# Patient Record
Sex: Female | Born: 1990 | Race: White | Hispanic: No | Marital: Single | Smoking: Never smoker
Health system: Southern US, Community
[De-identification: ages and names within clinical notes are randomized; demographics above are authoritative.]

## PROBLEM LIST (undated history)

## (undated) DIAGNOSIS — F419 Anxiety disorder, unspecified: Secondary | ICD-10-CM

## (undated) DIAGNOSIS — K219 Gastro-esophageal reflux disease without esophagitis: Secondary | ICD-10-CM

## (undated) HISTORY — DX: Gastro-esophageal reflux disease without esophagitis: K21.9

## (undated) HISTORY — DX: Anxiety disorder, unspecified: F41.9

## (undated) HISTORY — PX: WISDOM TOOTH EXTRACTION: SHX21

---
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
## 2011-04-02 IMAGING — US US ABDOMEN COMPLETE
1 series · 14 of 25 positions shown · non-contrast
Comparison: None

CLINICAL DATA: Abdominal pain

COMPLETE ABDOMINAL ULTRASOUND

[Series 1: us abdomen complete · 0.24mm/px · 14 of 71 slices shown]
[im 1/71]
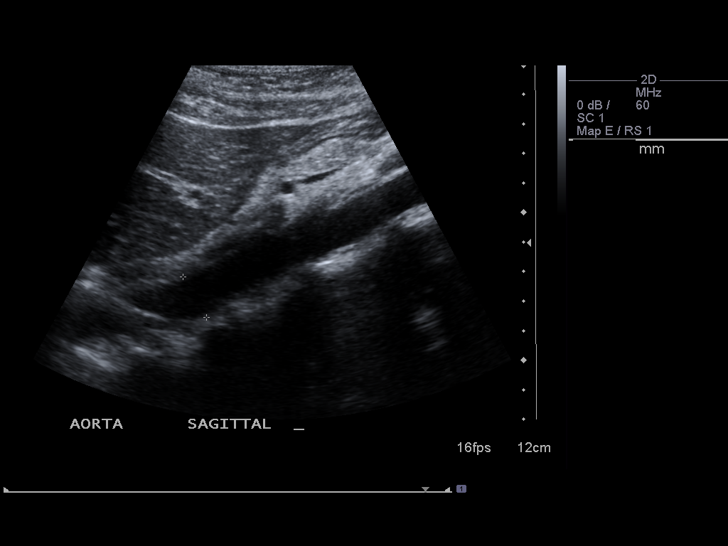
[im 6/71]
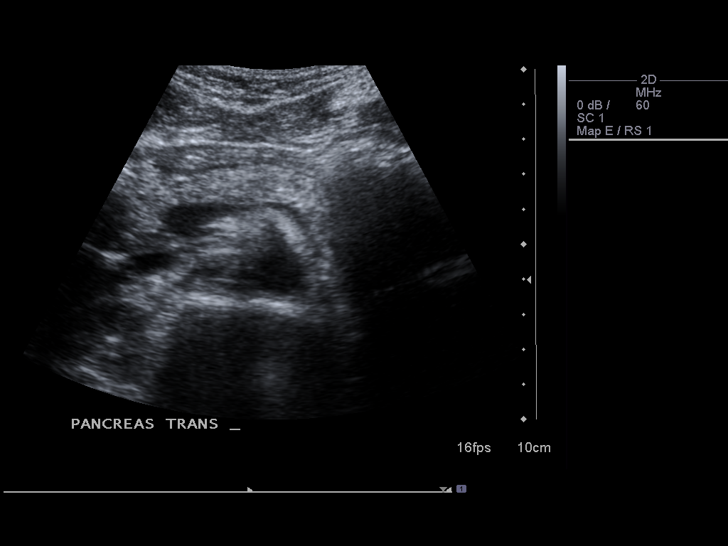
[im 12/71]
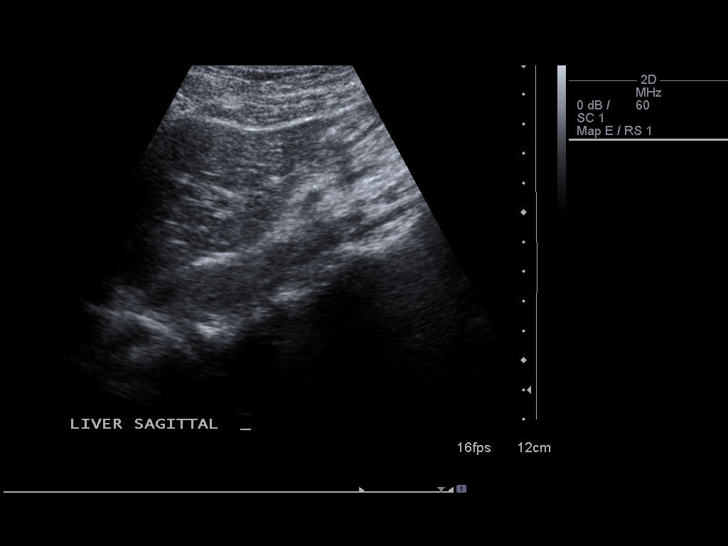
[im 18/71]
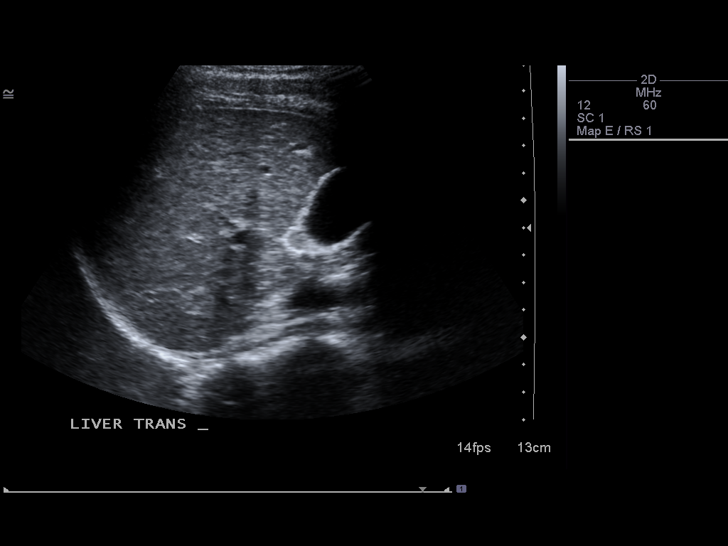
[im 24/71]
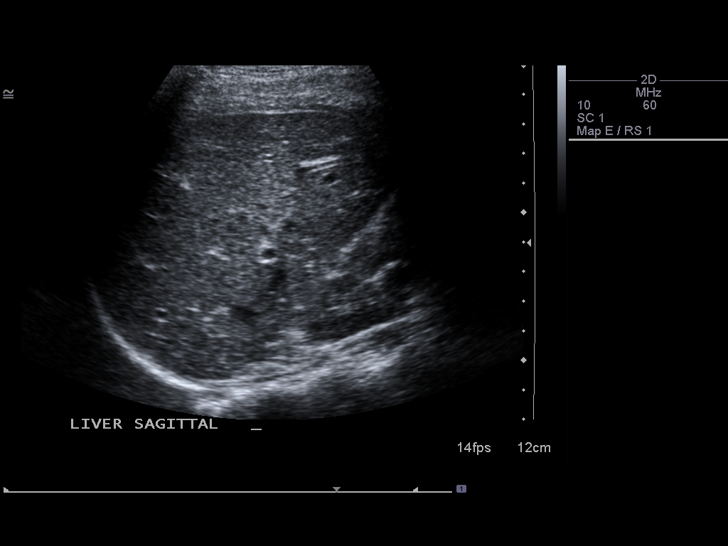
[im 27/71]
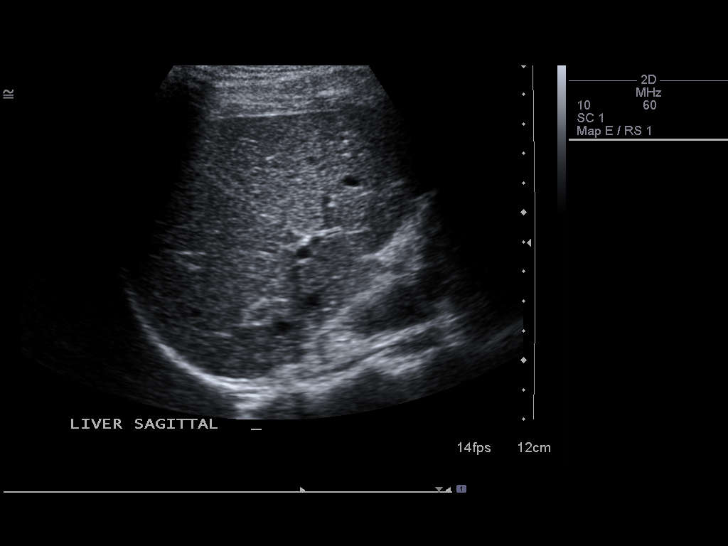
[im 33/71]
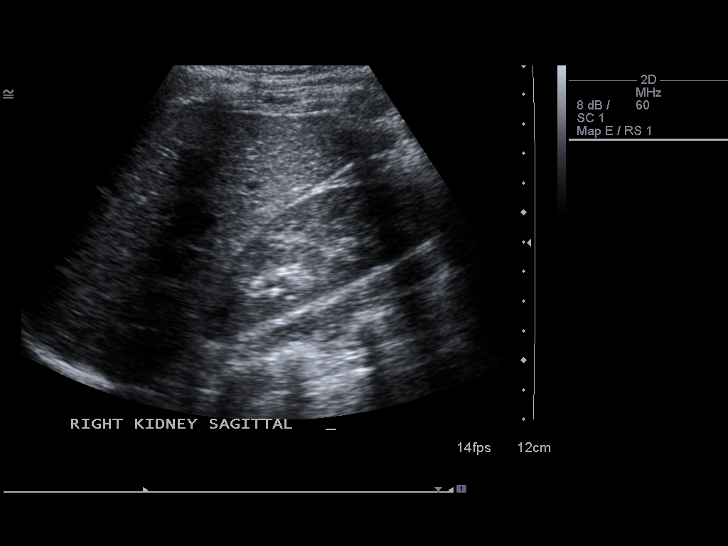
[im 38/71]
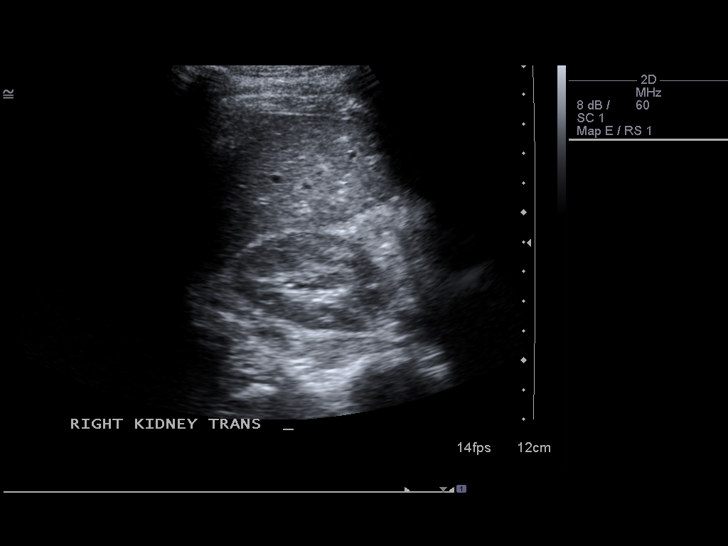
[im 44/71]
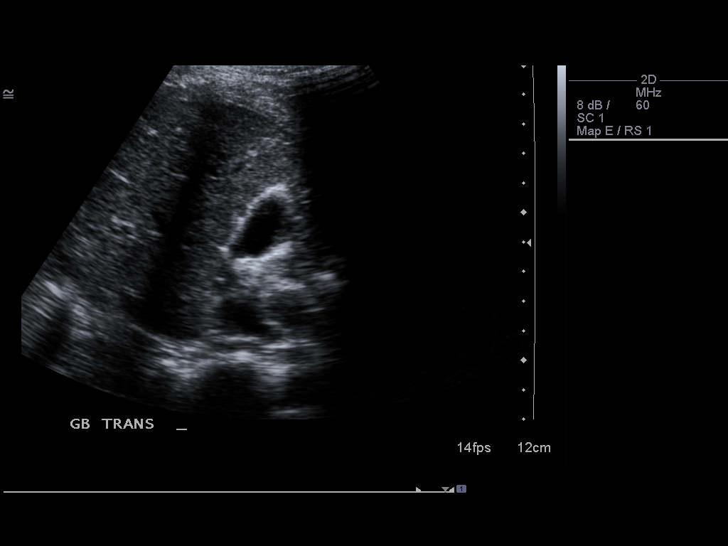
[im 47/71]
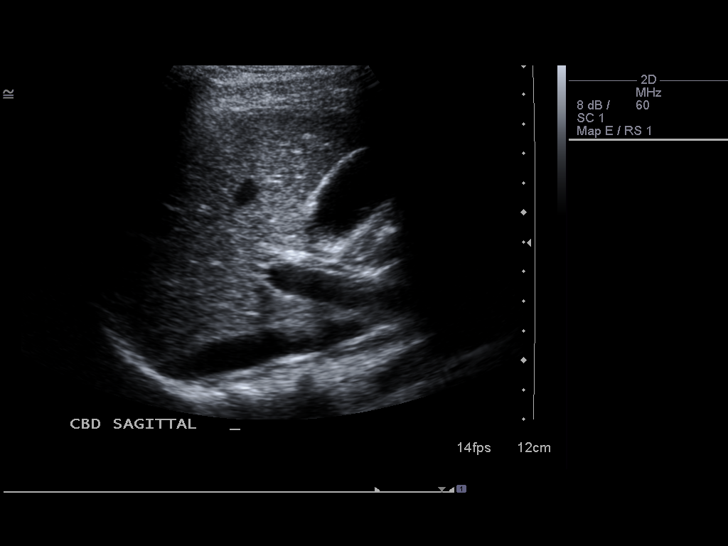
[im 53/71]
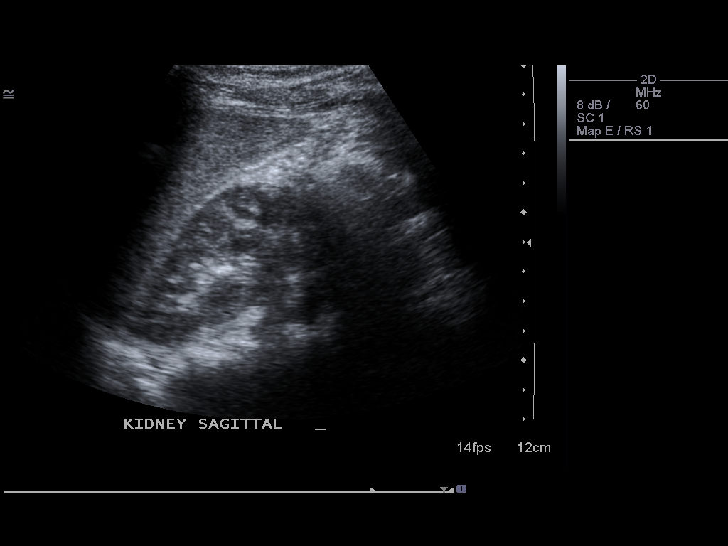
[im 59/71]
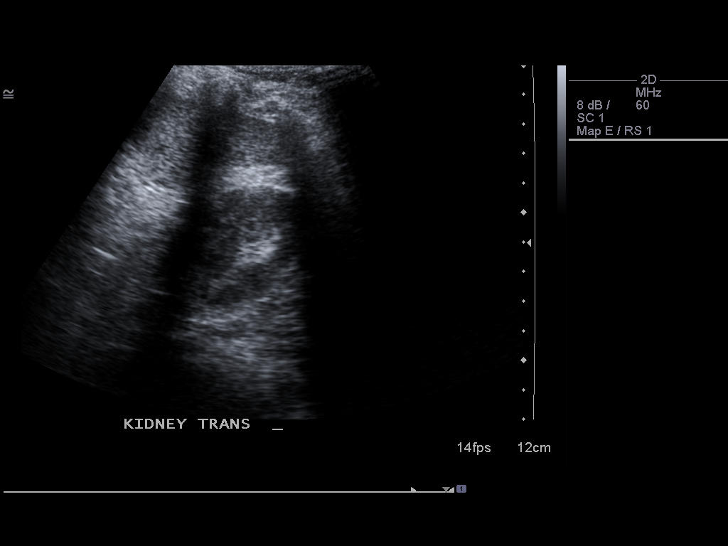
[im 65/71]
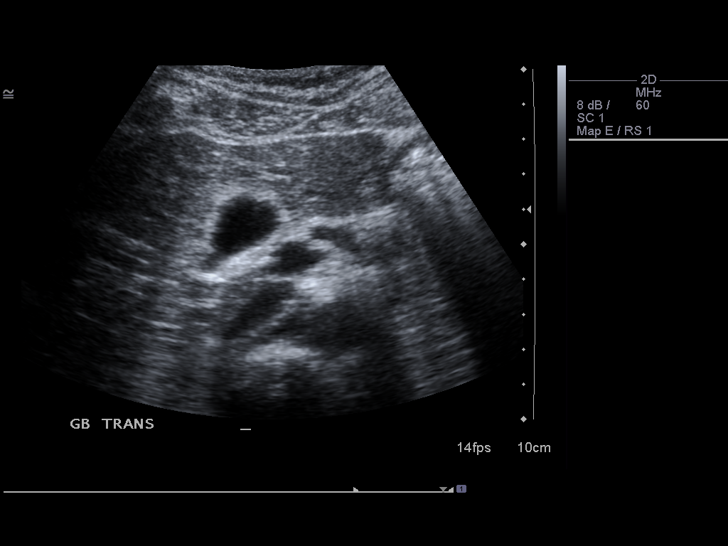
[im 71/71]
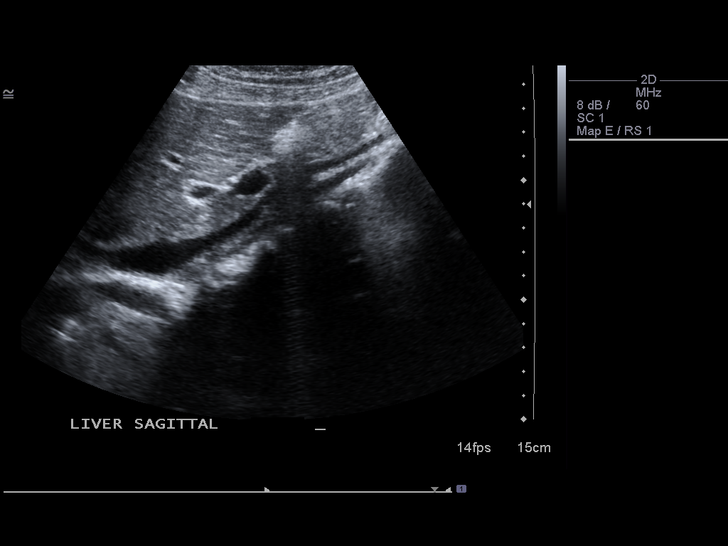

[14 of 25 positions shown; findings below may reference images not displayed]

FINDINGS: Gallbladder:  Normal without stones, sludge or wall thickening.

Common bile duct:  Normal at 2.5 mm.

Liver:  Normal echogenicity.  No focal lesions or biliary ductal
dilatation.

IVC:  Normal

Pancreas:  Normal

Spleen:  Normal at 9.3 cm.

Right Kidney:  Normal at 9.3 cm.  Echogenicity is normal.  No cyst,
mass, stone or hydronephrosis.

Left Kidney:  Similarly normal at 9.0 cm.

Abdominal aorta:  Normal.

No ascites.
IMPRESSION: Normal abdominal ultrasound.  No cause of pain identified.

## 2011-11-06 ENCOUNTER — Ambulatory Visit
Admission: RE | Admit: 2011-11-06 | Discharge: 2011-11-06 | Disposition: A | Discharge status: Home / Self Care | Payer: BC Managed Care – PPO | Source: Ambulatory Visit | Attending: Family Medicine | Admitting: Family Medicine

## 2011-11-06 ENCOUNTER — Other Ambulatory Visit: Payer: Self-pay | Admitting: Family Medicine

## 2011-11-06 DIAGNOSIS — R51 Headache: Secondary | ICD-10-CM

## 2012-01-02 ENCOUNTER — Ambulatory Visit (INDEPENDENT_AMBULATORY_CARE_PROVIDER_SITE_OTHER): Payer: BC Managed Care – PPO | Admitting: Internal Medicine

## 2012-01-02 VITALS — BP 114/72 | HR 69 | Temp 98.6°F | Resp 16 | Ht 63.5 in | Wt 132.0 lb

## 2012-01-02 DIAGNOSIS — R002 Palpitations: Secondary | ICD-10-CM

## 2012-01-02 DIAGNOSIS — G4700 Insomnia, unspecified: Secondary | ICD-10-CM | POA: Insufficient documentation

## 2012-01-02 DIAGNOSIS — F429 Obsessive-compulsive disorder, unspecified: Secondary | ICD-10-CM

## 2012-01-02 DIAGNOSIS — R0989 Other specified symptoms and signs involving the circulatory and respiratory systems: Secondary | ICD-10-CM

## 2012-01-02 DIAGNOSIS — R09A2 Foreign body sensation, throat: Secondary | ICD-10-CM

## 2012-01-02 MED ORDER — FLUOXETINE HCL 10 MG PO CAPS: ORAL_CAPSULE | ORAL | Status: DC

## 2012-01-02 NOTE — Progress Notes (Signed)
Subjective:    Patient ID: Lisa Clayton, female    DOB: Jun 28, 1990, 21 y.o.   MRN: [id number]  HPI Pt presents to office today to follow up after seeing a therapist in town earlier today. 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 She spent the summer at home in [place]. While home she began having thoughts of OCD, worrisome, and anxiousness. She is mostly worried about her future- I confirmed with her she had a great freshman year and grades were good. Her mother stated that this is not the first time this type of incident has occurred. When she was younger, she went through an episode of "worrying and OCD" type issues-Third-grade. Could not stay home alone due to fear/obsessive handwashing/obsessing with organization. Things seem moreintrusive with this current episode Ms. Gaut is having. She has a great deal of trouble Falling asleep Do to worrisome thoughts. Any depression symptoms are relatively mild and due to the anxiety that she feels about these worries. No panic attacks. The Pt expressed concern for herself not feeling like herself, as well as her mother did. The Pt returned from school after the spring semester and then shortly there after began having these thoughts and concerns. Since she is a returning sophomore tomorrow, the sense of pressure Ms. Montejano is feeling is overwhelming.  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"--thinking about career is one part of problem A little bored here this summer Brother Nate-[employer] senior/ADD no meds now Father has described having mild obsessions but no one else in the family has been diagnosed with OCD   Review of Systems Gained 12-15 pounds freshman year/understands why   Has had episodes of palpitations that made her stop exercising this summer/associated  with slight difficulty getting a deep breath/these would go way immediately if she would stop and control her thinking No syncope/no prior cardiac problems She also has had trouble with a sensation of something in her chest when she swallows, as if food gets stuck Was put on nexium earlier No hoarseness/no nausea vomiting/no change in appetite No suicide ideation/no memory problems or confusion/no overuse of alcohol or drugs History of dysmenorrhea controlled off oral contraceptives which she discontinued one to 2 months ago because her gynecologist was concerned that she might have headaches secondary Has been having frontal headaches on and off since late May/usually when most anxious/CT scan june was negative   Objective:   Physical Exam Filed Vitals:   01/02/12 1623  BP: 114/72  Pulse: 69  Temp: 98.6 F (37 C)  Resp: 16   HEENT clear No thyroid megaly or tenderness Heart regular without murmurs or clicks Neurological intact Mood good/affect stable       Assessment & Plan:   1. OCD (obsessive compulsive disorder)   2. Insomnia   3. Globus sensation   4. Palpitations    Meds ordered this encounter  Medications         . FLUoxetine (PROZAC) 10 MG capsule    Sig: Take one tablet a day for 7 days and then increase to 2 tablets once a day.May refill at 2 tablets once a day    Dispense:  60 capsule    Refill:  2  Continue counseling in [place] Followup here within the next 8-10 weeks/contact us phone sooner if needed Melatonin 5 mg at bedtime/if unsuccessful will  consider Klonopin short term

## 2012-01-02 NOTE — Patient Instructions (Signed)
Take 10 mg a day  For one week, then increase To 20 mg for the next 3 weeks you may take both pills at the same time When you refill your prescription you may continue to take the pills at once until we talk again to change that the dose if necessary Call if you have any questions

## 2012-01-03 ENCOUNTER — Telehealth: Payer: Self-pay

## 2012-01-03 MED ORDER — TRIAZOLAM 0.25 MG PO TABS: 0.2500 mg | ORAL_TABLET | Freq: Every evening | ORAL | Status: DC | PRN

## 2012-01-03 NOTE — Telephone Encounter (Signed)
See OV from yesterday-- rx'd Prozac and advised to try Melotonin at night for sleep.  Prozac will take time to build up in her system-- has she tried Melatonin as directed?  LMOM to CB.

## 2012-01-03 NOTE — Telephone Encounter (Signed)
She tried the Melatonin with no relief, would like something to help her sleep uses [employer].

## 2012-01-03 NOTE — Telephone Encounter (Signed)
I have spoken to her and she has changed her mind about which pharmacy to call this in to, I have called to [facility] [place] [phone]

## 2012-01-03 NOTE — Telephone Encounter (Signed)
The prozac is not working for sleeping.  Child is restless and mom would like a different medication for her.  Dr. Merla Riches   Call [phone]

## 2012-01-03 NOTE — Telephone Encounter (Signed)
We discussed this at her office visit and this is certainly fine/Halcion 0.25 #30 one at bedtime as needed and followup as planned/should be printed and waiting to be called in or faxed after signing

## 2012-01-05 ENCOUNTER — Telehealth: Payer: Self-pay

## 2012-01-05 NOTE — Telephone Encounter (Signed)
CAROL STATES HER DAUGHTER IS HAVING ANXIETY ISSUES AND DR DOOLITTLE HAD GIVEN HER PROZAC AND DR DAUB HAD GIVEN HER SOME MEDICINE TO HELP HER SLEEP AND NOTHING IS HELPING. SHE STILL IS HAVING PROBLEMS.  PLEASE CALL [phone]

## 2012-01-05 NOTE — Telephone Encounter (Signed)
Please advise-- should she RTC?

## 2012-01-05 NOTE — Telephone Encounter (Signed)
Having trouble at school/meds haven't started working yet He is still attending all classes Encouraged him to be patient in followup with me in 14-21 days if no effect from Prozac

## 2012-01-14 ENCOUNTER — Other Ambulatory Visit: Payer: Self-pay

## 2012-01-14 MED ORDER — FLUOXETINE HCL 10 MG PO CAPS: ORAL_CAPSULE | ORAL | Status: DC

## 2012-01-14 NOTE — Telephone Encounter (Signed)
Ok per Maralyn Sago, Mom notified.

## 2012-01-14 NOTE — Telephone Encounter (Signed)
Pt's mom called stated they are out of town in [place] and pt forgot meds.(Prozac) Pt needs 2 pills to be called into [facility] [phone] Please let pt know when done [phone]

## 2012-01-14 NOTE — Telephone Encounter (Signed)
Ok to rx

## 2012-01-30 ENCOUNTER — Telehealth: Payer: Self-pay

## 2012-01-30 NOTE — Telephone Encounter (Signed)
Please get details. 

## 2012-01-30 NOTE — Telephone Encounter (Signed)
Pt mom Hansini Clodfelter would like a return phone call, pt is taking anxiety medication, this is the fourth week and pt is not getting any results or feeling any better. [phone]

## 2012-01-31 NOTE — Telephone Encounter (Signed)
What dose is she taking of the Prozac?  Dr Merla Riches wanted her to increase this, she was instructed to start with 10mg  then increase to 20 mg. Not getting much relief yet,on the 20 mg dose. Please advise does the dosage need to be increased again.

## 2012-02-01 NOTE — Telephone Encounter (Signed)
Mother called today-Lisa Clayton has been on Prozac 20 mg for 4 weeks with little change/although she is doing okay at [employer], she has lots of negative thinking and lots of anxiety Mother will see her tonight and discuss whether she will followup in clinic this weekend whether or we will go up on her medicine to 30 mg of Prozac and see her in another 2 or 3 weeks.

## 2012-03-31 ENCOUNTER — Ambulatory Visit: Payer: BC Managed Care – PPO | Admitting: Internal Medicine

## 2012-03-31 VITALS — BP 118/77 | HR 66 | Temp 98.3°F | Resp 12 | Ht 63.0 in | Wt 133.0 lb

## 2012-03-31 DIAGNOSIS — Z23 Encounter for immunization: Secondary | ICD-10-CM

## 2012-03-31 DIAGNOSIS — F900 Attention-deficit hyperactivity disorder, predominantly inattentive type: Secondary | ICD-10-CM

## 2012-03-31 DIAGNOSIS — F988 Other specified behavioral and emotional disorders with onset usually occurring in childhood and adolescence: Secondary | ICD-10-CM

## 2012-03-31 DIAGNOSIS — F429 Obsessive-compulsive disorder, unspecified: Secondary | ICD-10-CM

## 2012-03-31 MED ORDER — AMPHETAMINE-DEXTROAMPHETAMINE 10 MG PO TABS: 10.0000 mg | ORAL_TABLET | Freq: Two times a day (BID) | ORAL | Status: DC

## 2012-03-31 MED ORDER — FLUOXETINE HCL 20 MG PO CAPS: ORAL_CAPSULE | ORAL | Status: DC

## 2012-03-31 NOTE — Progress Notes (Signed)
  Subjective:    Patient ID: Lisa Clayton, female    DOB: Sep 20, 1990, 21 y.o.   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 Has responded well to prozac with good sleep, control of obs thinking and 80% reduction in anxiety Good social Exercise less Now notes a big problem focusing during design 3 hr labs//also acknowleges being unable to complete paragraphs reading--hx of poor reading compr since grade school but with perfect behavior and working long hrs thruout school to get work done [employer] keys/room disorg/procrastinates brother age 46 w/ marked ADHD    Review of Systems No new sxt No ex intol    Objective:   Physical Exam Filed Vitals:   03/31/12 1339  BP: 118/77  Pulse: 66  Temp: 98.3 F (36.8 C)  Resp: 12  PERRLA-EOMs conj No thyromeg Ht reg w/out m,r,c,g Neuro intact    ADD/ADHD scale =48 with obvious inattention but surpringly also mild Hyperactiv sxt    Assessment & Plan:  ADHD Anxiety OCD symptoms  Meds ordered this encounter  Medications  . amphetamine-dextroamphetamine (ADDERALL) 10 MG tablet    Sig: Take 1 tablet (10 mg total) by mouth 2 (two) times daily.    Dispense:  60 tablet     To use meds in titration for lab and reading    Refill:  0  . FLUoxetine (PROZAC) 20 MG capsule    Sig: 2 tablets once a day    Dispense:  90 capsule    Refill:  1   F/u 4 -6 weeks xmas break OCD for dummies tho Mom has several books already

## 2012-04-24 ENCOUNTER — Ambulatory Visit: Payer: BC Managed Care – PPO | Admitting: Internal Medicine

## 2012-05-27 ENCOUNTER — Telehealth: Payer: Self-pay

## 2012-05-27 NOTE — Telephone Encounter (Signed)
Please advise 

## 2012-05-27 NOTE — Telephone Encounter (Signed)
Patient calling for refill on her adderall she missed appointment during the holidays but is now out would like dr Merla Riches to call her at [phone]

## 2012-05-28 MED ORDER — AMPHETAMINE-DEXTROAMPHETAMINE 10 MG PO TABS: 10.0000 mg | ORAL_TABLET | Freq: Two times a day (BID) | ORAL | Status: DC

## 2012-05-28 NOTE — Telephone Encounter (Signed)
Meds ordered this encounter  Medications  . amphetamine-dextroamphetamine (ADDERALL) 10 MG tablet    Sig: Take 1 tablet (10 mg total) by mouth 2 (two) times daily.    Dispense:  60 tablet    Refill:  0    

## 2012-05-28 NOTE — Telephone Encounter (Signed)
Pt reports that she is taking Adderall 10 mg Qam and sometimes she takes 1/2 or 1 tab in afternoons if needed for class/study schedule for a max of 10 mg BID.  Pt states that she will try to get in to see Dr Merla Riches on one of the weekends he works w/in the next month.

## 2012-05-28 NOTE — Telephone Encounter (Signed)
She was to titrate dose What is she using Can arange one month w/ f/u at spring break or convenient time

## 2012-05-29 NOTE — Telephone Encounter (Signed)
Called pt, advised Rx ready to pick up. 

## 2012-06-23 ENCOUNTER — Ambulatory Visit: Payer: BC Managed Care – PPO | Admitting: Internal Medicine

## 2012-06-23 VITALS — BP 122/85 | HR 71 | Temp 98.1°F | Resp 16 | Ht 63.5 in | Wt 132.0 lb

## 2012-06-23 DIAGNOSIS — F429 Obsessive-compulsive disorder, unspecified: Secondary | ICD-10-CM

## 2012-06-23 DIAGNOSIS — F988 Other specified behavioral and emotional disorders with onset usually occurring in childhood and adolescence: Secondary | ICD-10-CM

## 2012-06-23 MED ORDER — AMPHETAMINE-DEXTROAMPHETAMINE 10 MG PO TABS: 10.0000 mg | ORAL_TABLET | Freq: Two times a day (BID) | ORAL | Status: DC

## 2012-06-23 MED ORDER — FLUOXETINE HCL 20 MG PO CAPS: ORAL_CAPSULE | ORAL | Status: DC

## 2012-06-23 NOTE — Progress Notes (Signed)
  Subjective:    Patient ID: Lisa Clayton, female    DOB: 1990/09/02, 21 y.o.   MRN: [id number]  HPI followup for attention deficit disorder and excessive compulsive symptoms manifested with anxiety and insomnia Since her last visit she has had no trouble sleeping Her anxiety has been very well controlled, most likely due to the positive effects of Adderall She no longer has inefficiency with work that create sleep deprivation She is much better note taking/retention of information/task completion time She is no longer distractible  There is no social anxiety/no phobias/obsessive thinking about things has diminished    Review of Systems No depression No self injury No weight changes    Objective:   Physical Exam Vital signs stable Neurological intact Psychiatric intact       Assessment & Plan:  Problem 1 attention deficit disorder Problem #2 obsessive thinking with anxiety now stable  Plan continue Adderall/discussed specifics of duration and side effects  continue Prozac at 20 mg/consider decreasing Prozac the summer 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

## 2012-08-20 ENCOUNTER — Telehealth: Payer: Self-pay

## 2012-08-20 DIAGNOSIS — F429 Obsessive-compulsive disorder, unspecified: Secondary | ICD-10-CM

## 2012-08-20 NOTE — Telephone Encounter (Signed)
I need to pull chart

## 2012-08-20 NOTE — Telephone Encounter (Signed)
11/01/08 is chart date, these are next door.

## 2012-08-20 NOTE — Telephone Encounter (Signed)
Pt states she thinks she told dr Merla Riches the wrong dosage on her prozac,she actually takes 40 mg   Best phone [phone]  Pharmacy [facility] [address] in [place]

## 2012-08-21 ENCOUNTER — Other Ambulatory Visit: Payer: Self-pay | Admitting: Internal Medicine

## 2012-08-21 ENCOUNTER — Telehealth: Payer: Self-pay

## 2012-08-21 DIAGNOSIS — F429 Obsessive-compulsive disorder, unspecified: Secondary | ICD-10-CM

## 2012-08-21 MED ORDER — FLUOXETINE HCL 20 MG PO CAPS: 40.0000 mg | ORAL_CAPSULE | Freq: Every day | ORAL | Status: DC

## 2012-08-21 NOTE — Telephone Encounter (Signed)
Okay to continue Prozac 20 mg 2 tablets daily and she is considering decreasing her dose this summer Meds ordered this encounter  Medications  . FLUoxetine (PROZAC) 20 MG capsule    Sig: Take 2 capsules (40 mg total) by mouth daily.    Dispense:  180 capsule    Refill:  1

## 2012-08-21 NOTE — Telephone Encounter (Signed)
Called and LMOM at pt's number that corrected Rx was sent to pharmacy. Tried to call mother's number also and there was no ans/no VM.

## 2012-08-21 NOTE — Telephone Encounter (Signed)
I spoke to mother, and she advised she has been taking this bid , this was written in Aug and in Nov for bid, but at last visit was sent in for qd. Please advise. Pended at bid dose

## 2012-08-21 NOTE — Telephone Encounter (Signed)
Mom called:  Daughter is completely out of FLUoxetine (PROZAC) 20 MG capsule Has not taken for two days now and mom is concerned.   Moms phone number is [phone] - she will be hard to reach   Patient phone is [phone]  - a student and may not be available.   URGENT

## 2012-08-21 NOTE — Telephone Encounter (Signed)
On 06/23/12 pt was rx'd #90 with 1 RF.  Taking one per day as rx'd this should last 6 months.  Is this not the case?  Is there a RF at the pharmacy

## 2012-08-21 NOTE — Telephone Encounter (Signed)
Can we refill? 

## 2012-08-21 NOTE — Telephone Encounter (Signed)
Dr Merla Riches, see phone message before responding please.

## 2012-08-21 NOTE — Telephone Encounter (Signed)
Dr Merla Riches, I'm forwarding a Refill Request to you for pt also, pharmacy states Prozac 20 mg two tablets QD is correct dose. I wanted to check w/you before OKing the 40 mg dose. Do you want to send in for 40 mg Caps #30 or #90 instead of her taking two 20 mg? If you need paper chart it is stored at 104 (daily 11/01/08).

## 2012-08-21 NOTE — Telephone Encounter (Signed)
Corrected Rx was sent in and pt notified on VM. No ans/no VM set up on mother's #.

## 2012-09-23 ENCOUNTER — Telehealth: Payer: Self-pay

## 2012-09-23 DIAGNOSIS — F988 Other specified behavioral and emotional disorders with onset usually occurring in childhood and adolescence: Secondary | ICD-10-CM

## 2012-09-23 MED ORDER — AMPHETAMINE-DEXTROAMPHETAMINE 10 MG PO TABS: 10.0000 mg | ORAL_TABLET | Freq: Two times a day (BID) | ORAL | Status: DC

## 2012-09-23 NOTE — Telephone Encounter (Signed)
Please advise, pending.

## 2012-09-23 NOTE — Telephone Encounter (Signed)
PT IN NEED OF HER ADDERALL. PLEASE CALL [phone] WHEN READY FOR P/U

## 2012-09-23 NOTE — Telephone Encounter (Signed)
Meds ordered this encounter  Medications  . amphetamine-dextroamphetamine (ADDERALL) 10 MG tablet    Sig: Take 1 tablet (10 mg total) by mouth 2 (two) times daily. For 11/27/12    Dispense:  60 tablet    Refill:  0  . amphetamine-dextroamphetamine (ADDERALL) 10 MG tablet    Sig: Take 1 tablet (10 mg total) by mouth 2 (two) times daily. For 10/28/12    Dispense:  60 tablet    Refill:  0  . amphetamine-dextroamphetamine (ADDERALL) 10 MG tablet    Sig: Take 1 tablet (10 mg total) by mouth 2 (two) times daily.    Dispense:  60 tablet    Refill:  0

## 2012-09-24 NOTE — Telephone Encounter (Signed)
Called patient advised Rx ready for pick up 

## 2012-11-18 ENCOUNTER — Telehealth: Payer: Self-pay

## 2012-11-18 DIAGNOSIS — F429 Obsessive-compulsive disorder, unspecified: Secondary | ICD-10-CM

## 2012-11-18 NOTE — Telephone Encounter (Signed)
CAROL STATES HER DAUGHTER IN NEED OF HER PROZAC. PLEASE CALL [phone]

## 2012-11-18 NOTE — Telephone Encounter (Signed)
Please advise pended 

## 2012-11-19 MED ORDER — FLUOXETINE HCL 20 MG PO CAPS: 40.0000 mg | ORAL_CAPSULE | Freq: Every day | ORAL | Status: DC

## 2012-11-19 NOTE — Telephone Encounter (Signed)
Sent.  Due for follow up this summer, per Dr. Netta Corrigan last OV note

## 2012-11-26 ENCOUNTER — Encounter: Payer: Self-pay | Admitting: Certified Nurse Midwife

## 2012-12-03 ENCOUNTER — Ambulatory Visit: Payer: BC Managed Care – PPO | Admitting: Certified Nurse Midwife

## 2012-12-03 ENCOUNTER — Encounter: Payer: Self-pay | Admitting: Certified Nurse Midwife

## 2012-12-03 DIAGNOSIS — Z01419 Encounter for gynecological examination (general) (routine) without abnormal findings: Secondary | ICD-10-CM

## 2012-12-17 ENCOUNTER — Ambulatory Visit (INDEPENDENT_AMBULATORY_CARE_PROVIDER_SITE_OTHER): Payer: Managed Care, Other (non HMO) | Admitting: Certified Nurse Midwife

## 2012-12-17 ENCOUNTER — Encounter: Payer: Self-pay | Admitting: Certified Nurse Midwife

## 2012-12-17 VITALS — BP 100/62 | HR 60 | Resp 16 | Ht 63.25 in | Wt 138.0 lb

## 2012-12-17 DIAGNOSIS — Z0000 Encounter for general adult medical examination without abnormal findings: Secondary | ICD-10-CM

## 2012-12-17 DIAGNOSIS — Z01419 Encounter for gynecological examination (general) (routine) without abnormal findings: Secondary | ICD-10-CM

## 2012-12-17 NOTE — Patient Instructions (Signed)
General topics  Next pap or exam is  due in 1 year Take a Women's multivitamin Take 1200 mg. of calcium daily - prefer dietary If any concerns in interim to call back  Breast Self-Awareness Practicing breast self-awareness may pick up problems early, prevent significant medical complications, and possibly save your life. By practicing breast self-awareness, you can become familiar with how your breasts look and feel and if your breasts are changing. This allows you to notice changes early. It can also offer you some reassurance that your breast health is good. One way to learn what is normal for your breasts and whether your breasts are changing is to do a breast self-exam. If you find a lump or something that was not present in the past, it is best to contact your caregiver right away. Other findings that should be evaluated by your caregiver include nipple discharge, especially if it is bloody; skin changes or reddening; areas where the skin seems to be pulled in (retracted); or new lumps and bumps. Breast pain is seldom associated with cancer (malignancy), but should also be evaluated by a caregiver. BREAST SELF-EXAM The best time to examine your breasts is 5 7 days after your menstrual period is over.  ExitCare Patient Information 2013 [employer].   Exercise to Stay Healthy Exercise helps you become and stay healthy. EXERCISE IDEAS AND TIPS Choose exercises that:  You enjoy.  Fit into your day. You do not need to exercise really hard to be healthy. You can do exercises at a slow or medium level and stay healthy. You can:  Stretch before and after working out.  Try yoga, Pilates, or tai chi.  Lift weights.  Walk fast, swim, jog, run, climb stairs, bicycle, dance, or rollerskate.  Take aerobic classes. Exercises that burn about 150 calories:  Running 1  miles in 15 minutes.  Playing volleyball for 45 to 60 minutes.  Washing and waxing a car for 45 to 60  minutes.  Playing touch football for 45 minutes.  Walking 1  miles in 35 minutes.  Pushing a stroller 1  miles in 30 minutes.  Playing basketball for 30 minutes.  Raking leaves for 30 minutes.  Bicycling 5 miles in 30 minutes.  Walking 2 miles in 30 minutes.  Dancing for 30 minutes.  Shoveling snow for 15 minutes.  Swimming laps for 20 minutes.  Walking up stairs for 15 minutes.  Bicycling 4 miles in 15 minutes.  Gardening for 30 to 45 minutes.  Jumping rope for 15 minutes.  Washing windows or floors for 45 to 60 minutes. Document Released: 06/03/2010 Document Revised: 07/24/2011 Document Reviewed: 06/03/2010 ExitCare Patient Information 2013 [employer].   Other topics ( that may be useful information):    Sexually Transmitted Disease Sexually transmitted disease (STD) refers to any infection that is passed from person to person during sexual activity. This may happen by way of saliva, semen, blood, vaginal mucus, or urine. Common STDs include:  Gonorrhea.  Chlamydia.  Syphilis.  HIV/AIDS.  Genital herpes.  Hepatitis B and C.  Trichomonas.  Human papillomavirus (HPV).  Pubic lice. CAUSES  An STD may be spread by bacteria, virus, or parasite. A person can get an STD by:  Sexual intercourse with an infected person.  Sharing sex toys with an infected person.  Sharing needles with an infected person.  Having intimate contact with the genitals, mouth, or rectal areas of an infected person. SYMPTOMS  Some people may not have any symptoms, but   they can still pass the infection to others. Different STDs have different symptoms. Symptoms include:  Painful or bloody urination.  Pain in the pelvis, abdomen, vagina, anus, throat, or eyes.  Skin rash, itching, irritation, growths, or sores (lesions). These usually occur in the genital or anal area.  Abnormal vaginal discharge.  Penile discharge in men.  Soft, flesh-colored skin growths in the  genital or anal area.  Fever.  Pain or bleeding during sexual intercourse.  Swollen glands in the groin area.  Yellow skin and eyes (jaundice). This is seen with hepatitis. DIAGNOSIS  To make a diagnosis, your caregiver may:  Take a medical history.  Perform a physical exam.  Take a specimen (culture) to be examined.  Examine a sample of discharge under a microscope.  Perform blood test TREATMENT   Chlamydia, gonorrhea, trichomonas, and syphilis can be cured with antibiotic medicine.  Genital herpes, hepatitis, and HIV can be treated, but not cured, with prescribed medicines. The medicines will lessen the symptoms.  Genital warts from HPV can be treated with medicine or by freezing, burning (electrocautery), or surgery. Warts may come back.  HPV is a virus and cannot be cured with medicine or surgery.However, abnormal areas may be followed very closely by your caregiver and may be removed from the cervix, vagina, or vulva through office procedures or surgery. If your diagnosis is confirmed, your recent sexual partners need treatment. This is true even if they are symptom-free or have a negative culture or evaluation. They should not have sex until their caregiver says it is okay. HOME CARE INSTRUCTIONS  All sexual partners should be informed, tested, and treated for all STDs.  Take your antibiotics as directed. Finish them even if you start to feel better.  Only take over-the-counter or prescription medicines for pain, discomfort, or fever as directed by your caregiver.  Rest.  Eat a balanced diet and drink enough fluids to keep your urine clear or pale yellow.  Do not have sex until treatment is completed and you have followed up with your caregiver. STDs should be checked after treatment.  Keep all follow-up appointments, Pap tests, and blood tests as directed by your caregiver.  Only use latex condoms and water-soluble lubricants during sexual activity. Do not use  petroleum jelly or oils.  Avoid alcohol and illegal drugs.  Get vaccinated for HPV and hepatitis. If you have not received these vaccines in the past, talk to your caregiver about whether one or both might be right for you.  Avoid risky sex practices that can break the skin. The only way to avoid getting an STD is to avoid all sexual activity.Latex condoms and dental dams (for oral sex) will help lessen the risk of getting an STD, but will not completely eliminate the risk. SEEK MEDICAL CARE IF:   You have a fever.  You have any new or worsening symptoms. Document Released: 07/22/2002 Document Revised: 07/24/2011 Document Reviewed: 07/29/2010 ExitCare Patient Information 2013 [employer].    Domestic Abuse You are being battered or abused if someone close to you hits, pushes, or physically hurts you in any way. You also are being abused if you are forced into activities. You are being sexually abused if you are forced to have sexual contact of any kind. You are being emotionally abused if you are made to feel worthless or if you are constantly threatened. It is important to remember that help is available. No one has the right to abuse you. PREVENTION OF FURTHER   ABUSE  Learn the warning signs of danger. This varies with situations but may include: the use of alcohol, threats, isolation from friends and family, or forced sexual contact. Leave if you feel that violence is going to occur.  If you are attacked or beaten, report it to the police so the abuse is documented. You do not have to press charges. The police can protect you while you or the attackers are leaving. Get the officer's name and badge number and a copy of the report.  Find someone you can trust and tell them what is happening to you: your caregiver, a nurse, clergy member, close friend or family member. Feeling ashamed is natural, but remember that you have done nothing wrong. No one deserves abuse. Document Released:  04/28/2000 Document Revised: 07/24/2011 Document Reviewed: 07/07/2010 ExitCare Patient Information 2013 [employer].    How Much is Too Much Alcohol? Drinking too much alcohol can cause injury, accidents, and health problems. These types of problems can include:   Car crashes.  Falls.  Family fighting (domestic violence).  Drowning.  Fights.  Injuries.  Burns.  Damage to certain organs.  Having a baby with birth defects. ONE DRINK CAN BE TOO MUCH WHEN YOU ARE:  Working.  Pregnant or breastfeeding.  Taking medicines. Ask your doctor.  Driving or planning to drive. If you or someone you know has a drinking problem, get help from a doctor.  Document Released: 02/25/2009 Document Revised: 07/24/2011 Document Reviewed: 02/25/2009 ExitCare Patient Information 2013 [employer].   Smoking Hazards Smoking cigarettes is extremely bad for your health. Tobacco smoke has over 200 known poisons in it. There are over 60 chemicals in tobacco smoke that cause cancer. Some of the chemicals found in cigarette smoke include:   Cyanide.  Benzene.  Formaldehyde.  Methanol (wood alcohol).  Acetylene (fuel used in welding torches).  Ammonia. Cigarette smoke also contains the poisonous gases nitrogen oxide and carbon monoxide.  Cigarette smokers have an increased risk of many serious medical problems and Smoking causes approximately:  90% of all lung cancer deaths in men.  80% of all lung cancer deaths in women.  90% of deaths from chronic obstructive lung disease. Compared with nonsmokers, smoking increases the risk of:  Coronary heart disease by 2 to 4 times.  Stroke by 2 to 4 times.  Men developing lung cancer by 23 times.  Women developing lung cancer by 13 times.  Dying from chronic obstructive lung diseases by 12 times.  . Smoking is the most preventable cause of death and disease in our society.  WHY IS SMOKING ADDICTIVE?  Nicotine is the chemical  agent in tobacco that is capable of causing addiction or dependence.  When you smoke and inhale, nicotine is absorbed rapidly into the bloodstream through your lungs. Nicotine absorbed through the lungs is capable of creating a powerful addiction. Both inhaled and non-inhaled nicotine may be addictive.  Addiction studies of cigarettes and spit tobacco show that addiction to nicotine occurs mainly during the teen years, when young people begin using tobacco products. WHAT ARE THE BENEFITS OF QUITTING?  There are many health benefits to quitting smoking.   Likelihood of developing cancer and heart disease decreases. Health improvements are seen almost immediately.  Blood pressure, pulse rate, and breathing patterns start returning to normal soon after quitting. QUITTING SMOKING   American Lung Association - 1-800-LUNGUSA  American Cancer Society - 1-800-ACS-2345 Document Released: 06/08/2004 Document Revised: 07/24/2011 Document Reviewed: 02/10/2009 ExitCare Patient Information 2013 ExitCare[employer]   LLC.   Stress Management Stress is a state of physical or mental tension that often results from changes in your life or normal routine. Some common causes of stress are:  Death of a loved one.  Injuries or severe illnesses.  Getting fired or changing jobs.  Moving into a new home. Other causes may be:  Sexual problems.  Business or financial losses.  Taking on a large debt.  Regular conflict with someone at home or at work.  Constant tiredness from lack of sleep. It is not just bad things that are stressful. It may be stressful to:  Win the lottery.  Get married.  Buy a new car. The amount of stress that can be easily tolerated varies from person to person. Changes generally cause stress, regardless of the types of change. Too much stress can affect your health. It may lead to physical or emotional problems. Too little stress (boredom) may also become stressful. SUGGESTIONS TO  REDUCE STRESS:  Talk things over with your family and friends. It often is helpful to share your concerns and worries. If you feel your problem is serious, you may want to get help from a professional counselor.  Consider your problems one at a time instead of lumping them all together. Trying to take care of everything at once may seem impossible. List all the things you need to do and then start with the most important one. Set a goal to accomplish 2 or 3 things each day. If you expect to do too many in a single day you will naturally fail, causing you to feel even more stressed.  Do not use alcohol or drugs to relieve stress. Although you may feel better for a short time, they do not remove the problems that caused the stress. They can also be habit forming.  Exercise regularly - at least 3 times per week. Physical exercise can help to relieve that "uptight" feeling and will relax you.  The shortest distance between despair and hope is often a good night's sleep.  Go to bed and get up on time allowing yourself time for appointments without being rushed.  Take a short "time-out" period from any stressful situation that occurs during the day. Close your eyes and take some deep breaths. Starting with the muscles in your face, tense them, hold it for a few seconds, then relax. Repeat this with the muscles in your neck, shoulders, hand, stomach, back and legs.  Take good care of yourself. Eat a balanced diet and get plenty of rest.  Schedule time for having fun. Take a break from your daily routine to relax. HOME CARE INSTRUCTIONS   Call if you feel overwhelmed by your problems and feel you can no longer manage them on your own.  Return immediately if you feel like hurting yourself or someone else. Document Released: 10/25/2000 Document Revised: 07/24/2011 Document Reviewed: 06/17/2007 ExitCare Patient Information 2013 ExitCare, [employer].   

## 2012-12-17 NOTE — Progress Notes (Signed)
22 y.o. G0P0000 Single Caucasian Fe here for annual exam. Periods normal. Contraception condoms consistently. No partner change.  Desires Gc,Chlamydia screening only. College going well. No health concerns.  Patient's last menstrual period was 11/29/2012.          Sexually active: yes  The current method of family planning is condoms all the time.    Exercising: yes  running & the gym Smoker:  no  Health Maintenance: Pap:  never MMG:  none Colonoscopy:  none BMD:   none TDaP:  2006 Labs: none Self breast exam: done occ   reports that she has never smoked. She does not have any smokeless tobacco history on file. She reports that she does not drink alcohol or use illicit drugs.  Past Medical History  Diagnosis Date  . Acid reflux disease with ulcer     History reviewed. No pertinent past surgical history.  Current Outpatient Prescriptions  Medication Sig Dispense Refill  . amphetamine-dextroamphetamine (ADDERALL) 10 MG tablet Take 1 tablet (10 mg total) by mouth 2 (two) times daily. For 11/27/12  60 tablet  0  . esomeprazole (NEXIUM) 20 MG capsule Take 20 mg by mouth as needed.      Marland Kitchen FLUoxetine (PROZAC) 20 MG capsule Take 20 mg by mouth daily.       No current facility-administered medications for this visit.    Family History  Problem Relation Age of Onset  . Hypertension Father   . Hypertension Paternal Grandfather   . Heart disease Maternal Grandfather     ROS:  Pertinent items are noted in HPI.  Otherwise, a comprehensive ROS was negative.  Exam:   BP 100/62  Pulse 60  Resp 16  Ht 5' 3.25" (1.607 m)  Wt 138 lb (62.596 kg)  BMI 24.24 kg/m2  LMP 11/29/2012 Height: 5' 3.25" (160.7 cm)  Ht Readings from Last 3 Encounters:  12/17/12 5' 3.25" (1.607 m)  06/23/12 5' 3.5" (1.613 m)  03/31/12 5\' 3"  (1.6 m)    General appearance: alert, cooperative and appears stated age Head: Normocephalic, without obvious abnormality, atraumatic Neck: no adenopathy, supple,  symmetrical, trachea midline and thyroid normal to inspection and palpation Lungs: clear to auscultation bilaterally Breasts: normal appearance, no masses or tenderness, No nipple retraction or dimpling, No nipple discharge or bleeding, No axillary or supraclavicular adenopathy Heart: regular rate and rhythm Abdomen: soft, non-tender; no masses,  no organomegaly Extremities: extremities normal, atraumatic, no cyanosis or edema Skin: Skin color, texture, turgor normal. No rashes or lesions Lymph nodes: Cervical, supraclavicular, and axillary nodes normal. No abnormal inguinal nodes palpated Neurologic: Grossly normal   Pelvic: External genitalia:  no lesions              Urethra:  normal appearing urethra with no masses, tenderness or lesions              Bartholin's and Skene's: normal                 Vagina: normal appearing vagina with normal color and discharge, no lesions              Cervix: normal, non tender              Pap taken: no Bimanual Exam:  Uterus:  normal size, contour, position, consistency, mobility, non-tender and anteverted              Adnexa: normal adnexa and no mass, fullness, tenderness  Rectovaginal: Confirms               Anus:  normal sphincter tone, no lesions  A:  Well Woman with normal exam  Contraception condoms consistent  STD screening   P:   Reviewed health and wellness pertinent to exam  Lab:GC, Chlamydia  Pap smear as per guidelines   pap smear not taken today  counseled on breast self exam, STD prevention, adequate intake of calcium and vitamin D, diet and exercise  return annually or prn  An After Visit Summary was printed and given to the patient.

## 2012-12-19 NOTE — Progress Notes (Signed)
Note reviewed, agree with plan.  Aisea Bouldin, MD  

## 2012-12-30 ENCOUNTER — Telehealth: Payer: Self-pay

## 2012-12-30 DIAGNOSIS — F988 Other specified behavioral and emotional disorders with onset usually occurring in childhood and adolescence: Secondary | ICD-10-CM

## 2012-12-30 NOTE — Telephone Encounter (Signed)
PT STATES SHE WAS TOLD SHE NEEDED TO COME IN FOR HER MEDS, BUT SHE WOULD LIKE TO SPEAK WITH DR DOOLITTLE SINCE SHE IS IN [facility] AND WILL BE STARTING HER CLASSES BEFORE SHE IS ABLE TO COME. PLEASE CALL [phone]

## 2012-12-31 MED ORDER — AMPHETAMINE-DEXTROAMPHETAMINE 10 MG PO TABS: 10.0000 mg | ORAL_TABLET | Freq: Two times a day (BID) | ORAL | Status: DC

## 2012-12-31 NOTE — Telephone Encounter (Signed)
Is due for f/u but will excuse since college starting Meds ordered this encounter  Medications  . amphetamine-dextroamphetamine (ADDERALL) 10 MG tablet    Sig: Take 1 tablet (10 mg total) by mouth 2 (two) times daily. For 11/27/12    Dispense:  60 tablet    Refill:  0  see what can be set up for f/u as we need to discuss prozac as well---? Weekend in sept for clinic visit or can work in to clinic tomorrow at 10 or 430

## 2012-12-31 NOTE — Telephone Encounter (Signed)
Dr Merla Riches did you just want to refill her Rx until she can come in? Please advise

## 2013-01-01 ENCOUNTER — Telehealth: Payer: Self-pay

## 2013-01-01 NOTE — Telephone Encounter (Signed)
Refill amphetamine-dextroamphetamine (ADDERALL) 10 MG tablet Wants to talk with dr Merla Riches    [phone]

## 2013-01-01 NOTE — Telephone Encounter (Signed)
Called/mailbox full Try calling tomorrow for me-I'm back fri---left add for her 8/19

## 2013-01-01 NOTE — Telephone Encounter (Signed)
LM on cell phone to rtn call

## 2013-01-02 NOTE — Telephone Encounter (Signed)
Spoke with pt she will be in to see Dr Merla Riches as soon as she comes home.

## 2013-01-02 NOTE — Telephone Encounter (Signed)
Spoke with pt, advised message from Dr. Doolittle. Pt understood. 

## 2013-02-03 ENCOUNTER — Telehealth: Payer: Self-pay

## 2013-02-03 DIAGNOSIS — F988 Other specified behavioral and emotional disorders with onset usually occurring in childhood and adolescence: Secondary | ICD-10-CM

## 2013-02-03 NOTE — Telephone Encounter (Signed)
pts mom requesting adderall refil(pt completely out).She is away at college and is going to come in Friday Morning to see dr Lavenia Atlas but needs rx today.   Best phone for mom [phone]

## 2013-02-04 MED ORDER — AMPHETAMINE-DEXTROAMPHETAMINE 10 MG PO TABS: 10.0000 mg | ORAL_TABLET | Freq: Two times a day (BID) | ORAL | Status: DC

## 2013-02-04 NOTE — Telephone Encounter (Signed)
Meds ordered this encounter  Medications  . amphetamine-dextroamphetamine (ADDERALL) 10 MG tablet    Sig: Take 1 tablet (10 mg total) by mouth 2 (two) times daily. Needs Office Visit    Dispense:  60 tablet    Refill:  0

## 2013-02-04 NOTE — Telephone Encounter (Signed)
Have already sent to Dr Merla Riches and pended the medication.

## 2013-02-04 NOTE — Telephone Encounter (Signed)
Patient advised; Rx at front desk

## 2013-02-04 NOTE — Telephone Encounter (Signed)
CAROL WOULD LIKE DR DOOLITTLE TO GET THIS MESSAGE BEFORE HE STARTS WORKING TODAY. SHE WILL HAVE TO GO TO [facility] AND TAKE IT TO HER DAUGHTER PLEASE CALL [phone]

## 2013-02-07 ENCOUNTER — Ambulatory Visit: Payer: Managed Care, Other (non HMO) | Admitting: Internal Medicine

## 2013-02-07 VITALS — BP 118/74 | HR 94 | Temp 99.1°F | Resp 20 | Ht 63.5 in | Wt 138.2 lb

## 2013-02-07 DIAGNOSIS — F988 Other specified behavioral and emotional disorders with onset usually occurring in childhood and adolescence: Secondary | ICD-10-CM

## 2013-02-07 DIAGNOSIS — G4700 Insomnia, unspecified: Secondary | ICD-10-CM

## 2013-02-07 DIAGNOSIS — F429 Obsessive-compulsive disorder, unspecified: Secondary | ICD-10-CM

## 2013-02-07 MED ORDER — AMPHETAMINE-DEXTROAMPHETAMINE 20 MG PO TABS: 20.0000 mg | ORAL_TABLET | Freq: Two times a day (BID) | ORAL | Status: DC

## 2013-02-07 MED ORDER — FLUOXETINE HCL 10 MG PO TABS: 10.0000 mg | ORAL_TABLET | Freq: Every day | ORAL | Status: DC

## 2013-02-07 NOTE — Progress Notes (Signed)
  Subjective:    Patient ID: Lisa Clayton, female    DOB: 1990-09-10, 22 y.o.   MRN: [id number]  HPI Started Prozacand responded well last year/ interested in weaning off of this.  Wonders if she does not need it since she is doing so well on Adderall. Concerned about making any changes during the school year.   Adderall started last December - doing very well on this.   Takes Adderall 10 mg daily - possibly interested in increasing to 20 mg in the morning.  Has been taking 2/10 mg tablets on occasion which does seem to help more. Only will take the second dose of the 10 mg tablet if needed. No hx of panic attacks. She is otherwise doing well with no other concerns today.    Review of Systems  Constitutional: Negative for unexpected weight change.  Respiratory: Negative for chest tightness.   Cardiovascular: Negative for chest pain and palpitations.   no neurological problems No depression Sleeping well Good roommate situation     Objective:   Physical Exam BP 118/74  Pulse 94  Temp(Src) 99.1 F (37.3 C) (Oral)  Resp 20  Ht 5' 3.5" (1.613 m)  Wt 138 lb 3.2 oz (62.687 kg)  BMI 24.09 kg/m2  SpO2 98%  LMP 01/07/2013 HEENT clear Heart regular Cranial nerves II through XII intact Mood good Affect appropriate Thought content normal       Assessment & Plan:   ADD (attention deficit disorder) - Plan: amphetamine-dextroamphetamine (ADDERALL) 20 MG tablet bid  Insomnia-resolved  OCD (obsessive compulsive disorder)--- okay to reduce Prozac dose and watch for emergency symptoms  Meds ordered this encounter  Medications  . FLUoxetine (PROZAC) 10 MG tablet    Sig: Take 1 tablet (10 mg total) by mouth daily. Refill 3 times    Dispense:  90 tablet    Refill:  3  . amphetamine-dextroamphetamine (ADDERALL) 20 MG tablet    Sig: Take 1 tablet (20 mg total) by mouth 2 (two) times daily.    Dispense:  60 tablet    Refill:  0  . amphetamine-dextroamphetamine (ADDERALL) 20 MG  tablet    Sig: Take 1 tablet (20 mg total) by mouth 2 (two) times daily. 03/09/13    Dispense:  60 tablet    Refill:  0  . amphetamine-dextroamphetamine (ADDERALL) 20 MG tablet    Sig: Take 1 tablet (20 mg total) by mouth 2 (two) times daily. 04/09/13    Dispense:  60 tablet    Refill:  0   Followup 3-6 months depending on college schedule May have spring semester abroad and we can arrange those medicines in December

## 2013-05-20 ENCOUNTER — Telehealth: Payer: Self-pay

## 2013-05-20 DIAGNOSIS — F988 Other specified behavioral and emotional disorders with onset usually occurring in childhood and adolescence: Secondary | ICD-10-CM

## 2013-05-20 NOTE — Telephone Encounter (Signed)
Patient is calling to talk to dr Merla Richesdoolittle about she is going abroad and need to get some refills clarified with him please call her at [phone]

## 2013-05-21 NOTE — Telephone Encounter (Signed)
Dr Merla Richesoolittle does note she is studying abroad, what are her requests? Called but her mail box is full

## 2013-05-22 MED ORDER — AMPHETAMINE-DEXTROAMPHETAMINE 20 MG PO TABS: 20.0000 mg | ORAL_TABLET | Freq: Two times a day (BID) | ORAL | Status: DC

## 2013-05-22 MED ORDER — FLUOXETINE HCL 20 MG PO CAPS: 20.0000 mg | ORAL_CAPSULE | Freq: Every day | ORAL | Status: DC

## 2013-05-22 NOTE — Telephone Encounter (Signed)
Spoke to patient, she will be gone until the end of April, needs 3 months of the medications. Can she have 3 Rx's to fill at once? Or can we write all as one Rx? Can we do this? Pended. Someone may have to get filled here and mail to her. Please advise,  Dr Merla Richesoolittle.

## 2013-05-22 NOTE — Telephone Encounter (Signed)
Thank you. I have sent in the prozac, she did indicate she needs this, have printed the 3 month supply of the Adderall, will call her when signed.

## 2013-05-22 NOTE — Telephone Encounter (Signed)
We should be able to give her a 90 day supply that they would fill due to her travel/we may need to ask for more than that for Prozac

## 2013-05-23 NOTE — Telephone Encounter (Signed)
Notified pt ready. 

## 2013-05-31 ENCOUNTER — Telehealth: Payer: Self-pay

## 2013-05-31 NOTE — Telephone Encounter (Signed)
Pt picked up rx for prozac and the dosage was 20 mg and should be 10mg .  She is weening off and needs to get to 10 mg. Still needs #180 to taper off. She needs rx before Monday, b/c she is going abroad for school.

## 2013-06-01 ENCOUNTER — Other Ambulatory Visit: Payer: Self-pay | Admitting: Internal Medicine

## 2013-06-01 MED ORDER — FLUOXETINE HCL 10 MG PO TABS: 20.0000 mg | ORAL_TABLET | Freq: Every day | ORAL | Status: DC

## 2013-06-01 NOTE — Telephone Encounter (Signed)
Rx for prozac 10 mg 2 tablets daily for 3 months to taper off

## 2013-06-28 IMAGING — CT CT HEAD W/O CM
2 series · 16 of 30 positions shown, 18 images · non-contrast
Comparison: None.

CLINICAL DATA: 20-year-old female with frontal pain.  Dizziness.
Decreased congestion with antibiotics.  Headache.

CT HEAD WITHOUT CONTRAST
TECHNIQUE: Contiguous axial images were obtained from the base of
the skull through the vertex without contrast.

[Series 2: head w/o · axial · non-contrast · 0.43mm/px · z∈[-0,+120]mm · 8 of 32 slices shown, 10 images]
[im 4/32  brain]
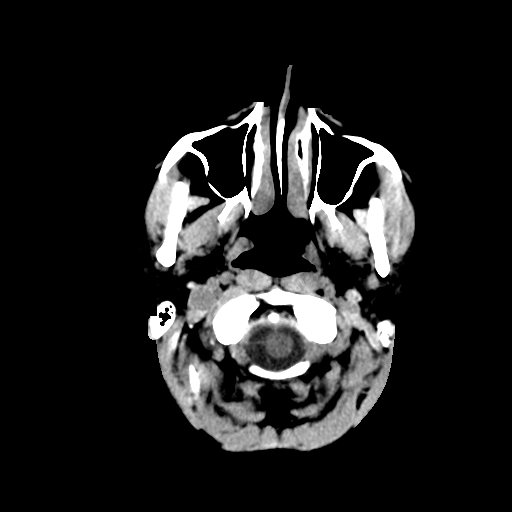
[im 4/32  bone]
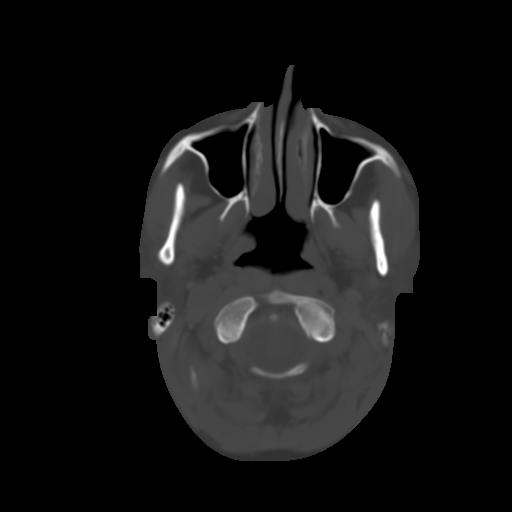
[im 7/32  brain]
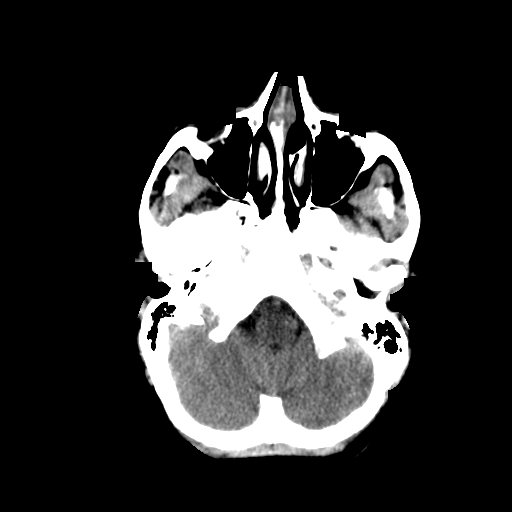
[im 11/32  brain]
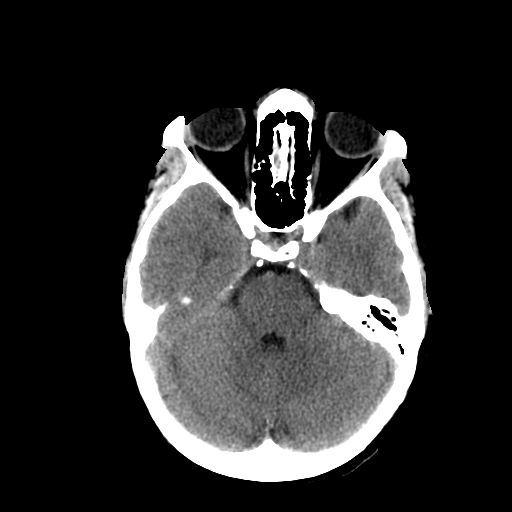
[im 14/32  brain]
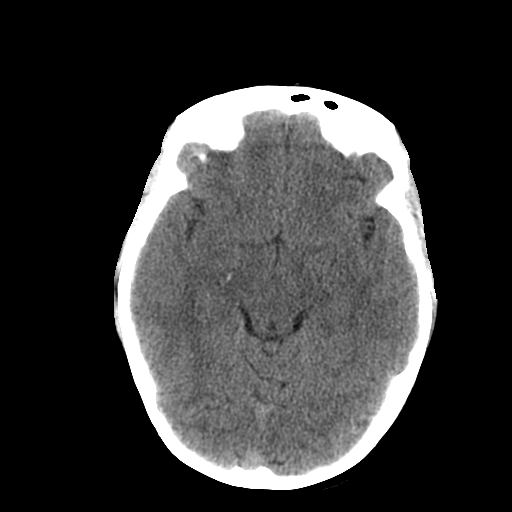
[im 18/32  brain]
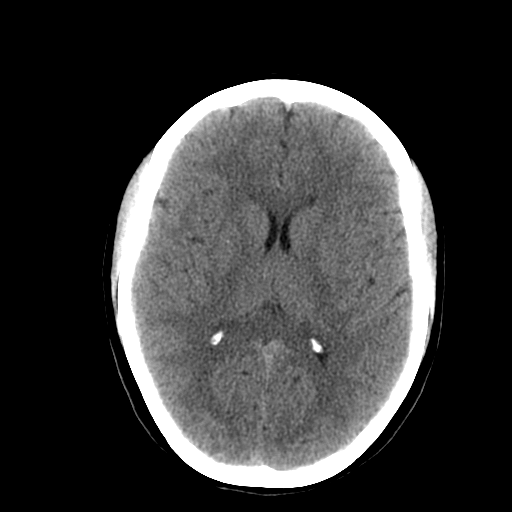
[im 18/32  bone]
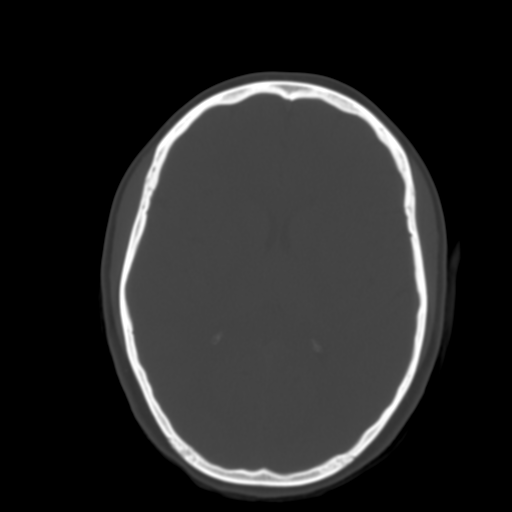
[im 21/32  brain]
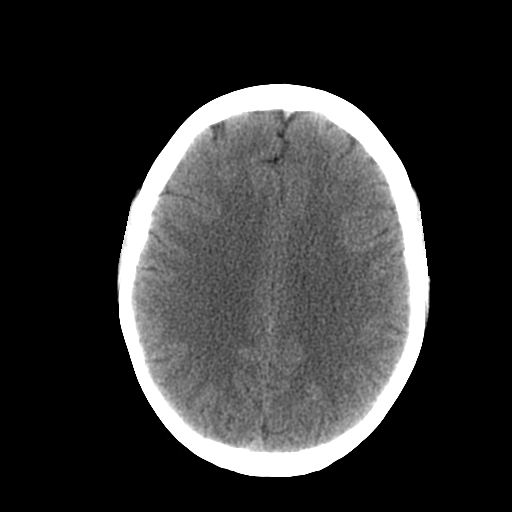
[im 25/32  brain]
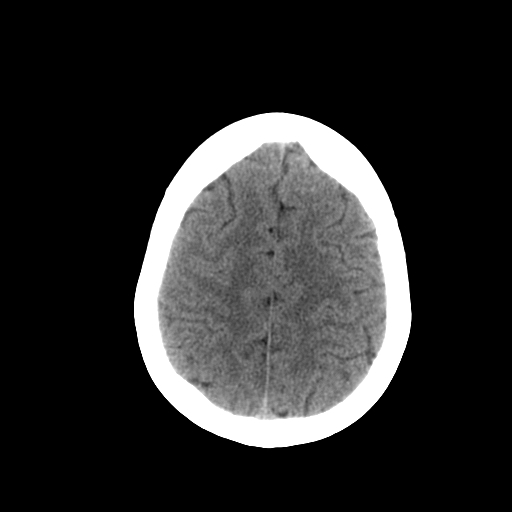
[im 28/32  brain]
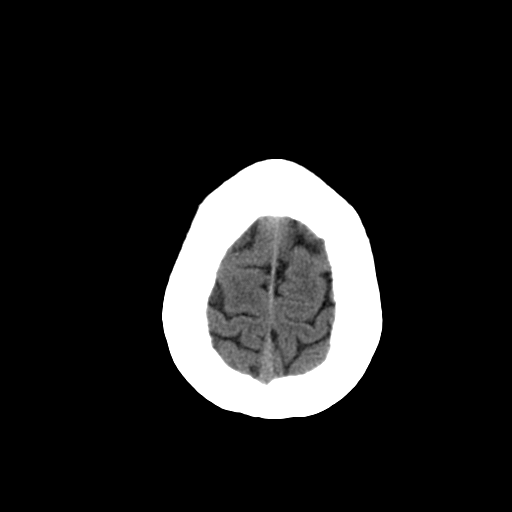

[Series 3: head bone · axial · 0.43mm/px · z∈[-2,+124]mm · 8 of 64 slices shown]
[im 7/64  bone]
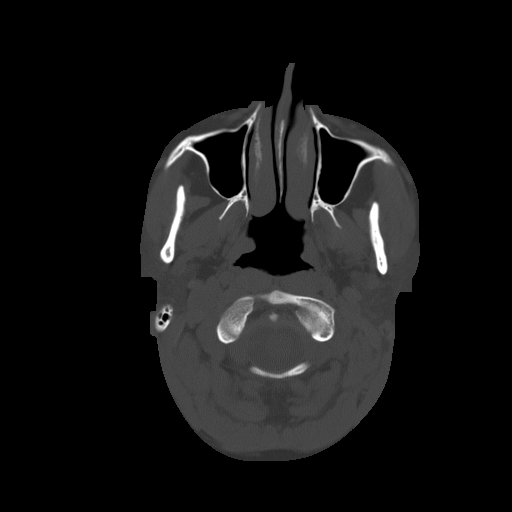
[im 14/64  bone]
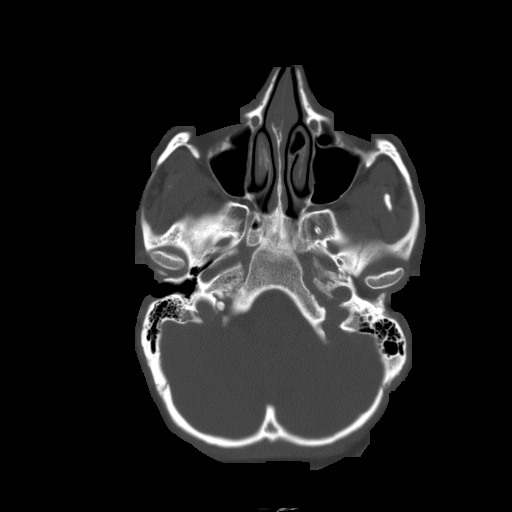
[im 20/64  bone]
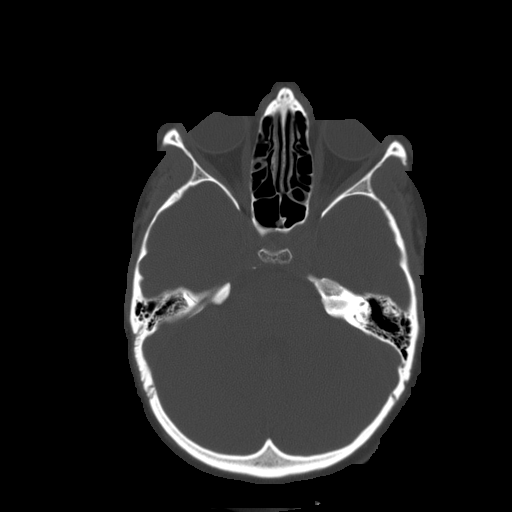
[im 27/64  bone]
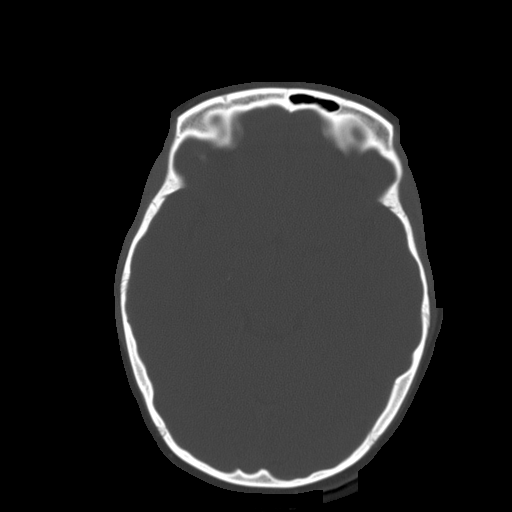
[im 37/64  bone]
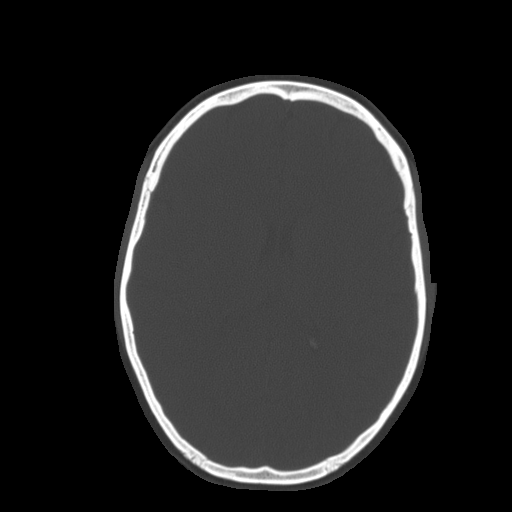
[im 44/64  bone]
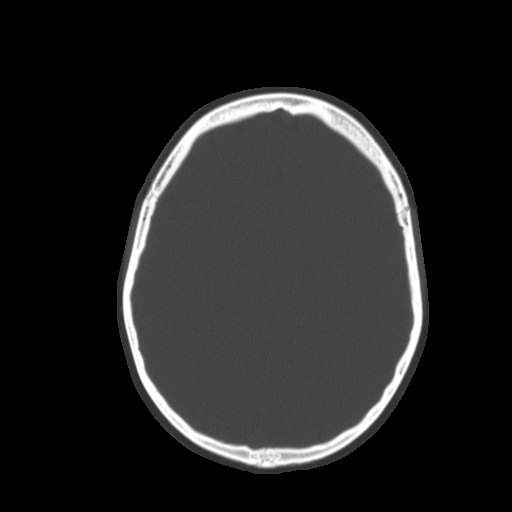
[im 50/64  bone]
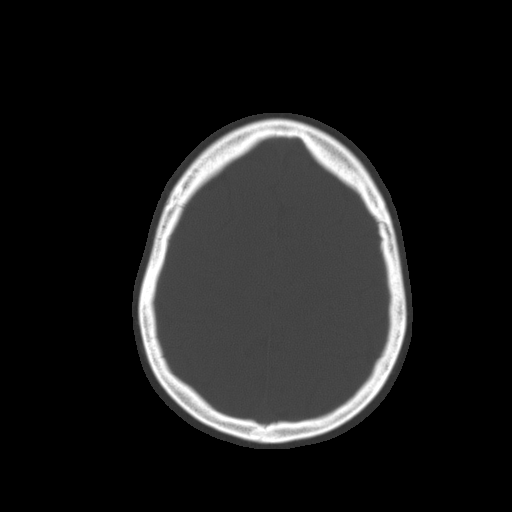
[im 57/64  bone]
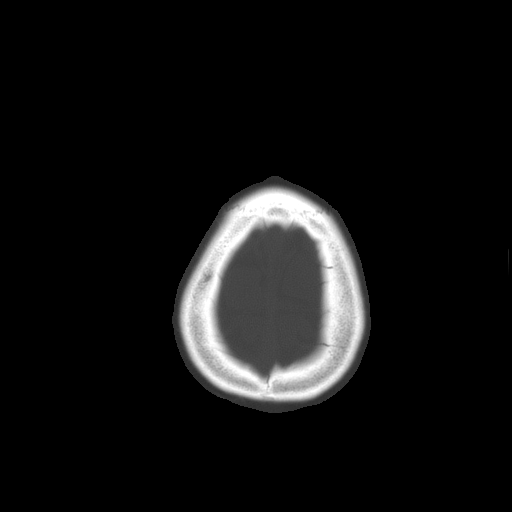

[16 of 30 positions shown; findings below may reference images not displayed]

FINDINGS: The paranasal sinuses are clear except for minimal
inferior anterior ethmoid mucosal thickening on the right ( series
3 image 17).  The right frontal sinus is hypoplastic.  Incidental
concha bullosa on the left.

The visualized mastoid air cells and tympanic cavities are clear.
Visualized orbit soft tissues are within normal limits.  Visualized
scalp soft tissues are within normal limits.  Negative visualized
deep soft tissue spaces of the face.

Cerebral volume is within normal limits for age.  No midline shift,
ventriculomegaly, mass effect, evidence of mass lesion,
intracranial hemorrhage or evidence of cortically based acute
infarction.  Gray-white matter differentiation is within normal
limits throughout the brain.  No suspicious intracranial vascular
hyperdensity.
IMPRESSION: Normal noncontrast CT appearance of the brain. Negative paranasal
sinuses.

## 2013-09-17 ENCOUNTER — Ambulatory Visit: Payer: Managed Care, Other (non HMO) | Admitting: Internal Medicine

## 2013-09-27 ENCOUNTER — Telehealth: Payer: Self-pay

## 2013-09-27 DIAGNOSIS — F988 Other specified behavioral and emotional disorders with onset usually occurring in childhood and adolescence: Secondary | ICD-10-CM

## 2013-09-27 NOTE — Telephone Encounter (Signed)
Patient called requesting a refill for Adderall, not sure if its 10 mg or 20 mg. Please [facility], [address] [place], [place] [phone].Marland Kitchen.Pharmacy

## 2013-09-28 MED ORDER — AMPHETAMINE-DEXTROAMPHETAMINE 20 MG PO TABS: 20.0000 mg | ORAL_TABLET | Freq: Two times a day (BID) | ORAL | Status: DC

## 2013-09-28 NOTE — Telephone Encounter (Signed)
Meds ordered this encounter  Medications  . DISCONTD: amphetamine-dextroamphetamine (ADDERALL) 20 MG tablet    Sig: Take 1 tablet (20 mg total) by mouth 2 (two) times daily.    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 amphetamine-dextroamphetamine (ADDERALL) 20 MG tablet    Sig: Take 1 tablet (20 mg total) by mouth 2 (two) times daily. 03/09/13    Dispense:  60 tablet    Refill:  0   Call to be sure this is her address and not pharmacy in [place] and tell her it's time for f/u this summer

## 2013-09-28 NOTE — Telephone Encounter (Signed)
Pt's mother is going to come by to p/u rx

## 2013-10-08 ENCOUNTER — Ambulatory Visit (INDEPENDENT_AMBULATORY_CARE_PROVIDER_SITE_OTHER): Payer: Managed Care, Other (non HMO) | Admitting: Internal Medicine

## 2013-10-08 ENCOUNTER — Encounter: Payer: Self-pay | Admitting: Internal Medicine

## 2013-10-08 VITALS — BP 110/80 | HR 61 | Temp 98.7°F | Resp 16 | Ht 63.0 in | Wt 144.4 lb

## 2013-10-08 DIAGNOSIS — R0989 Other specified symptoms and signs involving the circulatory and respiratory systems: Secondary | ICD-10-CM

## 2013-10-08 DIAGNOSIS — G4700 Insomnia, unspecified: Secondary | ICD-10-CM

## 2013-10-08 DIAGNOSIS — F988 Other specified behavioral and emotional disorders with onset usually occurring in childhood and adolescence: Secondary | ICD-10-CM

## 2013-10-08 DIAGNOSIS — R002 Palpitations: Secondary | ICD-10-CM

## 2013-10-08 DIAGNOSIS — F458 Other somatoform disorders: Secondary | ICD-10-CM

## 2013-10-08 MED ORDER — ALPRAZOLAM 0.25 MG PO TABS: 0.2500 mg | ORAL_TABLET | Freq: Two times a day (BID) | ORAL | Status: DC | PRN

## 2013-10-08 MED ORDER — AMPHETAMINE-DEXTROAMPHETAMINE 20 MG PO TABS: 20.0000 mg | ORAL_TABLET | Freq: Two times a day (BID) | ORAL | Status: DC

## 2013-10-08 NOTE — Progress Notes (Addendum)
This chart was scribed for Tonye Pearson, MD by Joaquin Music, ED Scribe. This patient was seen in room Room/bed 28 and the patient's care was started at 3:31 PM. Subjective:    Patient ID: Lisa Clayton, female    DOB: 1990-06-09, 23 y.o.   MRN: [id number] Chief Complaint  Patient presents with  . Follow-up    ANXIETY   HPI Lisa Clayton is a 23 y.o. female with hx of ADD and OCD and who presents to the [facility] for anxiety F/U apt. Pt states she went to [place] in [place] for a spring study abroad trip taking [employer], civil (consulting) courses. 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 Reports taking Adderall while abroad but states she was taking PRN. States she has not been taking Prozac "for a while"; states she hasn't taken it for about 2 months ago. States she generally gets chest tightness when anxiety initiates but states "she can control this". Pt states her breathing has helped her with her anxiety; states she takes deep breaths when necessary. Will be back at [employer] in the fall and has 1 more year; states "she is not ready for this to be over".  Is requesting Adderall. Denies needing vaccines or other medications while in [place]. Reports taking natural vitamins.   Patient Active Problem List   Diagnosis Date Noted  . 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 OCD (obsessive compulsive disorder) 01/02/2012  . Insomnia 01/02/2012  . PES PLANUS 10/16/2006  . UNEQUAL LEG LENGTH, ACQUIRED 10/16/2006  . SHIN SPLINTS 10/16/2006   Current outpatient prescriptions:amphetamine-dextroamphetamine (ADDERALL) 20 MG tablet, Take 1 tablet (20 mg total) by mouth 2 (two) times daily. 03/09/13, Disp: 60 tablet, Rfl: 0;  esomeprazole (NEXIUM) 20 MG capsule, Take 20 mg by mouth as needed., Disp: , Rfl: ;  FLUoxetine (PROZAC) 10 MG tablet, Take 2 tablets (20 mg total) by mouth daily. Refill 3 times, Disp: 180 tablet,  Rfl: 0 FLUoxetine (PROZAC) 20 MG capsule, Take 1 capsule (20 mg total) by mouth daily., Disp: 90 capsule, Rfl: 0  Review of Systems  All other systems reviewed and are negative.  Objective:   Physical Exam  Nursing note and vitals reviewed. Constitutional: She is oriented to person, place, and time. She appears well-developed and well-nourished. No distress.  HENT:  Head: Normocephalic and atraumatic.  Right Ear: External ear normal.  Left Ear: External ear normal.  Eyes: Conjunctivae are normal. Pupils are equal, round, and reactive to light.  Neck: Normal range of motion. Neck supple.  Cardiovascular: Normal rate.   No murmur heard. Pulmonary/Chest: Effort normal.  Abdominal: Bowel sounds are normal.  Musculoskeletal: Normal range of motion.  Neurological: She is alert and oriented to person, place, and time. She has normal reflexes.  Skin: Skin is warm and dry. She is not diaphoretic.  Psychiatric: She has a normal mood and affect. Her behavior is normal.   Triage Vitals:BP 110/80  Pulse 61  Temp(Src) 98.7 F (37.1 C) (Oral)  Resp 16  Ht 5\' 3"  (1.6 m)  Wt 144 lb 6.4 oz (65.499 kg)  BMI 25.59 kg/m2  SpO2 98%  LMP 09/20/2013 Assessment & Plan:  ADD (attention deficit disorder) - Plan: amphetamine-dextroamphetamine (ADDERALL) 20 MG tablet  Insomnia Globus sensation Palpitations  All sxt dormant now--will follow off prozac  To use xanax if relapse in [place]--f/u fall 2015   Meds ordered this encounter  Medications  .  amphetamine-dextroamphetamine (ADDERALL) 20 MG tablet    Sig: Take 1 tablet (20 mg total) by mouth 2 (two) times daily. For 10/29/13 or after    Dispense:  60 tablet    Refill:  0  . ALPRAZolam (XANAX) 0.25 MG tablet    Sig: Take 1 tablet (0.25 mg total) by mouth 2 (two) times daily as needed for anxiety.    Dispense:  20 tablet    Refill:  1     I have completed the patient encounter in its entirety as documented by the scribe, with editing by me  where necessary. Nicolis Boody P. Merla Richesoolittle, M.D.

## 2013-12-02 ENCOUNTER — Telehealth: Payer: Self-pay

## 2013-12-02 DIAGNOSIS — F988 Other specified behavioral and emotional disorders with onset usually occurring in childhood and adolescence: Secondary | ICD-10-CM

## 2013-12-02 NOTE — Telephone Encounter (Signed)
CAROL STATES HER DAUGHTER IS IN NEED OF HER ADDERALL 20MG S, IF SHE HAD MORE REFILLS THEY HAVE MISPLACED IT. PLEASE CALL [phone]

## 2013-12-03 MED ORDER — AMPHETAMINE-DEXTROAMPHETAMINE 20 MG PO TABS: 20.0000 mg | ORAL_TABLET | Freq: Two times a day (BID) | ORAL | Status: DC

## 2013-12-03 NOTE — Telephone Encounter (Signed)
Mom called to check on status of adderall.

## 2013-12-03 NOTE — Telephone Encounter (Signed)
Notified mother ready. 

## 2013-12-03 NOTE — Telephone Encounter (Signed)
Rx printed. Initially had incorrect sig - that copy shredded. Re-printed with correct sig

## 2014-04-28 ENCOUNTER — Encounter: Payer: Self-pay | Admitting: Certified Nurse Midwife

## 2014-04-28 ENCOUNTER — Ambulatory Visit (INDEPENDENT_AMBULATORY_CARE_PROVIDER_SITE_OTHER): Payer: PRIVATE HEALTH INSURANCE | Admitting: Certified Nurse Midwife

## 2014-04-28 VITALS — BP 110/70 | HR 64 | Resp 16 | Ht 63.5 in | Wt 140.0 lb

## 2014-04-28 DIAGNOSIS — Z01419 Encounter for gynecological examination (general) (routine) without abnormal findings: Secondary | ICD-10-CM

## 2014-04-28 DIAGNOSIS — Z30011 Encounter for initial prescription of contraceptive pills: Secondary | ICD-10-CM

## 2014-04-28 DIAGNOSIS — Z124 Encounter for screening for malignant neoplasm of cervix: Secondary | ICD-10-CM

## 2014-04-28 MED ORDER — NORETHIN ACE-ETH ESTRAD-FE 1-20 MG-MCG(24) PO TABS: 1.0000 | ORAL_TABLET | Freq: Every day | ORAL | Status: DC

## 2014-04-28 NOTE — Progress Notes (Signed)
23 y.o. G0P0000 Single Caucasian Fe here for annual exam. Periods normal, no issues. Back on OCP with Gildess 24 working well. Needs Rx for year today. No STD concerns today or testing desired.   Sees student health if problems. No health issues today.  Patient's last menstrual period was 03/31/2014.          Sexually active: Yes.    The current method of family planning is OCP (estrogen/progesterone).    Exercising: Yes.    cardio Smoker:  no  Health Maintenance: Pap:  none MMG:  none Colonoscopy:  none BMD:   none TDaP:  2006 Labs: none Self breast exam: done occ   reports that she has never smoked. She does not have any smokeless tobacco history on file. She reports that she does not drink alcohol or use illicit drugs.  Past Medical History  Diagnosis Date  . Acid reflux disease with ulcer   . Anxiety     Past Surgical History  Procedure Laterality Date  . Wisdom tooth extraction      Current Outpatient Prescriptions  Medication Sig Dispense Refill  . amphetamine-dextroamphetamine (ADDERALL) 20 MG tablet Take 1 tablet (20 mg total) by mouth 2 (two) times daily. 60 tablet 0  . Norethindrone Acetate-Ethinyl Estrad-FE (GILDESS 24 FE) 1-20 MG-MCG(24) tablet Take 1 tablet by mouth daily.    Marland Kitchen. ALPRAZolam (XANAX) 0.25 MG tablet Take 1 tablet (0.25 mg total) by mouth 2 (two) times daily as needed for anxiety. (Patient not taking: Reported on 04/28/2014) 20 tablet 1   No current facility-administered medications for this visit.    Family History  Problem Relation Age of Onset  . Hypertension Father   . Hypertension Paternal Grandfather   . Cancer Paternal Grandfather     throat   . Heart disease Maternal Grandfather     ROS:  Pertinent items are noted in HPI.  Otherwise, a comprehensive ROS was negative.  Exam:   BP 110/70 mmHg  Pulse 64  Resp 16  Ht 5' 3.5" (1.613 m)  Wt 140 lb (63.504 kg)  BMI 24.41 kg/m2  LMP 03/31/2014 Height: 5' 3.5" (161.3 cm)  Ht Readings  from Last 3 Encounters:  04/28/14 5' 3.5" (1.613 m)  10/08/13 5\' 3"  (1.6 m)  02/07/13 5' 3.5" (1.613 m)    General appearance: alert, cooperative and appears stated age Head: Normocephalic, without obvious abnormality, atraumatic Neck: no adenopathy, supple, symmetrical, trachea midline and thyroid normal to inspection and palpation Lungs: clear to auscultation bilaterally Breasts: normal appearance, no masses or tenderness, No nipple retraction or dimpling, No nipple discharge or bleeding, No axillary or supraclavicular adenopathy Heart: regular rate and rhythm Abdomen: soft, non-tender; no masses,  no organomegaly Extremities: extremities normal, atraumatic, no cyanosis or edema Skin: Skin color, texture, turgor normal. No rashes or lesions Lymph nodes: Cervical, supraclavicular, and axillary nodes normal. No abnormal inguinal nodes palpated Neurologic: Grossly normal   Pelvic: External genitalia:  no lesions              Urethra:  normal appearing urethra with no masses, tenderness or lesions              Bartholin's and Skene's: normal                 Vagina: normal appearing vagina with normal color and discharge, no lesions              Cervix: normal, non tender, no lesions  Pap taken: Yes.   Bimanual Exam:  Uterus:  normal size, contour, position, consistency, mobility, non-tender and anteverted              Adnexa: normal adnexa and no mass, fullness, tenderness               Rectovaginal: Confirms               Anus:  normal appearance  A:  Well Woman with normal exam  Contraception OCP desired  P:   Reviewed health and wellness pertinent to exam  Rx Junel 24 FE see order  Pap smear taken today with HPV reflex   counseled on breast self exam, STD prevention, HIV risk factors and prevention, use and side effects of OCP's, adequate intake of calcium and vitamin D, diet and exercise  return annually or prn  An After Visit Summary was printed and given to  the patient.   d

## 2014-04-28 NOTE — Patient Instructions (Signed)
General topics  Next pap or exam is  due in 1 year Take a Women's multivitamin Take 1200 mg. of calcium daily - prefer dietary If any concerns in interim to call back  Breast Self-Awareness Practicing breast self-awareness may pick up problems early, prevent significant medical complications, and possibly save your life. By practicing breast self-awareness, you can become familiar with how your breasts look and feel and if your breasts are changing. This allows you to notice changes early. It can also offer you some reassurance that your breast health is good. One way to learn what is normal for your breasts and whether your breasts are changing is to do a breast self-exam. If you find a lump or something that was not present in the past, it is best to contact your caregiver right away. Other findings that should be evaluated by your caregiver include nipple discharge, especially if it is bloody; skin changes or reddening; areas where the skin seems to be pulled in (retracted); or new lumps and bumps. Breast pain is seldom associated with cancer (malignancy), but should also be evaluated by a caregiver. BREAST SELF-EXAM The best time to examine your breasts is 5 7 days after your menstrual period is over.  ExitCare Patient Information 2013 [employer].   Exercise to Stay Healthy Exercise helps you become and stay healthy. EXERCISE IDEAS AND TIPS Choose exercises that:  You enjoy.  Fit into your day. You do not need to exercise really hard to be healthy. You can do exercises at a slow or medium level and stay healthy. You can:  Stretch before and after working out.  Try yoga, Pilates, or tai chi.  Lift weights.  Walk fast, swim, jog, run, climb stairs, bicycle, dance, or rollerskate.  Take aerobic classes. Exercises that burn about 150 calories:  Running 1  miles in 15 minutes.  Playing volleyball for 45 to 60 minutes.  Washing and waxing a car for 45 to 60  minutes.  Playing touch football for 45 minutes.  Walking 1  miles in 35 minutes.  Pushing a stroller 1  miles in 30 minutes.  Playing basketball for 30 minutes.  Raking leaves for 30 minutes.  Bicycling 5 miles in 30 minutes.  Walking 2 miles in 30 minutes.  Dancing for 30 minutes.  Shoveling snow for 15 minutes.  Swimming laps for 20 minutes.  Walking up stairs for 15 minutes.  Bicycling 4 miles in 15 minutes.  Gardening for 30 to 45 minutes.  Jumping rope for 15 minutes.  Washing windows or floors for 45 to 60 minutes. Document Released: 06/03/2010 Document Revised: 07/24/2011 Document Reviewed: 06/03/2010 ExitCare Patient Information 2013 [employer].   Other topics ( that may be useful information):    Sexually Transmitted Disease Sexually transmitted disease (STD) refers to any infection that is passed from person to person during sexual activity. This may happen by way of saliva, semen, blood, vaginal mucus, or urine. Common STDs include:  Gonorrhea.  Chlamydia.  Syphilis.  HIV/AIDS.  Genital herpes.  Hepatitis B and C.  Trichomonas.  Human papillomavirus (HPV).  Pubic lice. CAUSES  An STD may be spread by bacteria, virus, or parasite. A person can get an STD by:  Sexual intercourse with an infected person.  Sharing sex toys with an infected person.  Sharing needles with an infected person.  Having intimate contact with the genitals, mouth, or rectal areas of an infected person. SYMPTOMS  Some people may not have any symptoms, but   they can still pass the infection to others. Different STDs have different symptoms. Symptoms include:  Painful or bloody urination.  Pain in the pelvis, abdomen, vagina, anus, throat, or eyes.  Skin rash, itching, irritation, growths, or sores (lesions). These usually occur in the genital or anal area.  Abnormal vaginal discharge.  Penile discharge in men.  Soft, flesh-colored skin growths in the  genital or anal area.  Fever.  Pain or bleeding during sexual intercourse.  Swollen glands in the groin area.  Yellow skin and eyes (jaundice). This is seen with hepatitis. DIAGNOSIS  To make a diagnosis, your caregiver may:  Take a medical history.  Perform a physical exam.  Take a specimen (culture) to be examined.  Examine a sample of discharge under a microscope.  Perform blood test TREATMENT   Chlamydia, gonorrhea, trichomonas, and syphilis can be cured with antibiotic medicine.  Genital herpes, hepatitis, and HIV can be treated, but not cured, with prescribed medicines. The medicines will lessen the symptoms.  Genital warts from HPV can be treated with medicine or by freezing, burning (electrocautery), or surgery. Warts may come back.  HPV is a virus and cannot be cured with medicine or surgery.However, abnormal areas may be followed very closely by your caregiver and may be removed from the cervix, vagina, or vulva through office procedures or surgery. If your diagnosis is confirmed, your recent sexual partners need treatment. This is true even if they are symptom-free or have a negative culture or evaluation. They should not have sex until their caregiver says it is okay. HOME CARE INSTRUCTIONS  All sexual partners should be informed, tested, and treated for all STDs.  Take your antibiotics as directed. Finish them even if you start to feel better.  Only take over-the-counter or prescription medicines for pain, discomfort, or fever as directed by your caregiver.  Rest.  Eat a balanced diet and drink enough fluids to keep your urine clear or pale yellow.  Do not have sex until treatment is completed and you have followed up with your caregiver. STDs should be checked after treatment.  Keep all follow-up appointments, Pap tests, and blood tests as directed by your caregiver.  Only use latex condoms and water-soluble lubricants during sexual activity. Do not use  petroleum jelly or oils.  Avoid alcohol and illegal drugs.  Get vaccinated for HPV and hepatitis. If you have not received these vaccines in the past, talk to your caregiver about whether one or both might be right for you.  Avoid risky sex practices that can break the skin. The only way to avoid getting an STD is to avoid all sexual activity.Latex condoms and dental dams (for oral sex) will help lessen the risk of getting an STD, but will not completely eliminate the risk. SEEK MEDICAL CARE IF:   You have a fever.  You have any new or worsening symptoms. Document Released: 07/22/2002 Document Revised: 07/24/2011 Document Reviewed: 07/29/2010 [facility] Patient Information 2013 [employer].    Domestic Abuse You are being battered or abused if someone close to you hits, pushes, or physically hurts you in any way. You also are being abused if you are forced into activities. You are being sexually abused if you are forced to have sexual contact of any kind. You are being emotionally abused if you are made to feel worthless or if you are constantly threatened. It is important to remember that help is available. No one has the right to abuse you. PREVENTION OF FURTHER  ABUSE  Learn the warning signs of danger. This varies with situations but may include: the use of alcohol, threats, isolation from friends and family, or forced sexual contact. Leave if you feel that violence is going to occur.  If you are attacked or beaten, report it to the police so the abuse is documented. You do not have to press charges. The police can protect you while you or the attackers are leaving. Get the officer's name and badge number and a copy of the report.  Find someone you can trust and tell them what is happening to you: your caregiver, a nurse, clergy member, close friend or family member. Feeling ashamed is natural, but remember that you have done nothing wrong. No one deserves abuse. Document Released:  04/28/2000 Document Revised: 07/24/2011 Document Reviewed: 07/07/2010 ExitCare Patient Information 2013 [employer].    How Much is Too Much Alcohol? Drinking too much alcohol can cause injury, accidents, and health problems. These types of problems can include:   Car crashes.  Falls.  Family fighting (domestic violence).  Drowning.  Fights.  Injuries.  Burns.  Damage to certain organs.  Having a baby with birth defects. ONE DRINK CAN BE TOO MUCH WHEN YOU ARE:  Working.  Pregnant or breastfeeding.  Taking medicines. Ask your doctor.  Driving or planning to drive. If you or someone you know has a drinking problem, get help from a doctor.  Document Released: 02/25/2009 Document Revised: 07/24/2011 Document Reviewed: 02/25/2009 ExitCare Patient Information 2013 [employer].   Smoking Hazards Smoking cigarettes is extremely bad for your health. Tobacco smoke has over 200 known poisons in it. There are over 60 chemicals in tobacco smoke that cause cancer. Some of the chemicals found in cigarette smoke include:   Cyanide.  Benzene.  Formaldehyde.  Methanol (wood alcohol).  Acetylene (fuel used in welding torches).  Ammonia. Cigarette smoke also contains the poisonous gases nitrogen oxide and carbon monoxide.  Cigarette smokers have an increased risk of many serious medical problems and Smoking causes approximately:  90% of all lung cancer deaths in men.  80% of all lung cancer deaths in women.  90% of deaths from chronic obstructive lung disease. Compared with nonsmokers, smoking increases the risk of:  Coronary heart disease by 2 to 4 times.  Stroke by 2 to 4 times.  Men developing lung cancer by 23 times.  Women developing lung cancer by 13 times.  Dying from chronic obstructive lung diseases by 12 times.  . Smoking is the most preventable cause of death and disease in our society.  WHY IS SMOKING ADDICTIVE?  Nicotine is the chemical  agent in tobacco that is capable of causing addiction or dependence.  When you smoke and inhale, nicotine is absorbed rapidly into the bloodstream through your lungs. Nicotine absorbed through the lungs is capable of creating a powerful addiction. Both inhaled and non-inhaled nicotine may be addictive.  Addiction studies of cigarettes and spit tobacco show that addiction to nicotine occurs mainly during the teen years, when young people begin using tobacco products. WHAT ARE THE BENEFITS OF QUITTING?  There are many health benefits to quitting smoking.   Likelihood of developing cancer and heart disease decreases. Health improvements are seen almost immediately.  Blood pressure, pulse rate, and breathing patterns start returning to normal soon after quitting. QUITTING SMOKING   American Lung Association - 1-800-LUNGUSA  American Cancer Society - 1-800-ACS-2345 Document Released: 06/08/2004 Document Revised: 07/24/2011 Document Reviewed: 02/10/2009 ExitCare Patient Information 2013 ExitCare,   LLC.   Stress Management Stress is a state of physical or mental tension that often results from changes in your life or normal routine. Some common causes of stress are:  Death of a loved one.  Injuries or severe illnesses.  Getting fired or changing jobs.  Moving into a new home. Other causes may be:  Sexual problems.  Business or financial losses.  Taking on a large debt.  Regular conflict with someone at home or at work.  Constant tiredness from lack of sleep. It is not just bad things that are stressful. It may be stressful to:  Win the lottery.  Get married.  Buy a new car. The amount of stress that can be easily tolerated varies from person to person. Changes generally cause stress, regardless of the types of change. Too much stress can affect your health. It may lead to physical or emotional problems. Too little stress (boredom) may also become stressful. SUGGESTIONS TO  REDUCE STRESS:  Talk things over with your family and friends. It often is helpful to share your concerns and worries. If you feel your problem is serious, you may want to get help from a professional counselor.  Consider your problems one at a time instead of lumping them all together. Trying to take care of everything at once may seem impossible. List all the things you need to do and then start with the most important one. Set a goal to accomplish 2 or 3 things each day. If you expect to do too many in a single day you will naturally fail, causing you to feel even more stressed.  Do not use alcohol or drugs to relieve stress. Although you may feel better for a short time, they do not remove the problems that caused the stress. They can also be habit forming.  Exercise regularly - at least 3 times per week. Physical exercise can help to relieve that "uptight" feeling and will relax you.  The shortest distance between despair and hope is often a good night's sleep.  Go to bed and get up on time allowing yourself time for appointments without being rushed.  Take a short "time-out" period from any stressful situation that occurs during the day. Close your eyes and take some deep breaths. Starting with the muscles in your face, tense them, hold it for a few seconds, then relax. Repeat this with the muscles in your neck, shoulders, hand, stomach, back and legs.  Take good care of yourself. Eat a balanced diet and get plenty of rest.  Schedule time for having fun. Take a break from your daily routine to relax. HOME CARE INSTRUCTIONS   Call if you feel overwhelmed by your problems and feel you can no longer manage them on your own.  Return immediately if you feel like hurting yourself or someone else. Document Released: 10/25/2000 Document Revised: 07/24/2011 Document Reviewed: 06/17/2007 ExitCare Patient Information 2013 ExitCare, [employer].   

## 2014-04-29 NOTE — Progress Notes (Signed)
Reviewed personally.  M. Suzanne Bryne Lindon, MD.  

## 2014-04-30 ENCOUNTER — Telehealth: Payer: Self-pay | Admitting: Certified Nurse Midwife

## 2014-04-30 LAB — IPS PAP TEST WITH REFLEX TO HPV

## 2014-04-30 NOTE — Telephone Encounter (Signed)
Patient thinks the wrong birth control was called to her pharmacy.

## 2014-04-30 NOTE — Telephone Encounter (Signed)
Patient calling. Was given Junel FE 1/20 at the pharmacy.  She is concerned that it has 7 iron pills at the end of the pack instead of usual 4 iron pills that she has prior.  Advised patient that rx for Gildess 24 FE was sent to her pharmacy and pharmacy may have substituted Junel FE for Gildess. Advised hormones are the same. Advised if patient would specifically only like Gildess she can call local pharmacies as different pharmacies carry different generics for substitution.  Patient will try new rx and call with any concerns.  Advised would send message to Verner Choleborah S. Leonard CNM to review as well and would call her back with any new instructions.  Patient agreeable.

## 2014-05-11 ENCOUNTER — Ambulatory Visit: Payer: Managed Care, Other (non HMO) | Admitting: Certified Nurse Midwife

## 2014-05-19 ENCOUNTER — Other Ambulatory Visit: Payer: Self-pay

## 2014-05-19 DIAGNOSIS — F988 Other specified behavioral and emotional disorders with onset usually occurring in childhood and adolescence: Secondary | ICD-10-CM

## 2014-05-19 NOTE — Telephone Encounter (Signed)
The patient called to request refill of her Adderall Rx.  The patient states that she is going back to school tomorrow, and needs to pick up prescription prior to leaving for school.  Please call the patient at [phone] once the Rx is ready for pick up.

## 2014-05-20 MED ORDER — AMPHETAMINE-DEXTROAMPHETAMINE 20 MG PO TABS: 20.0000 mg | ORAL_TABLET | Freq: Two times a day (BID) | ORAL | Status: DC

## 2014-05-20 NOTE — Telephone Encounter (Signed)
Mother Okey RegalCarol would like us to call her at [phone] when ready.

## 2014-05-21 NOTE — Telephone Encounter (Signed)
Mother has p/up. 

## 2014-07-08 ENCOUNTER — Other Ambulatory Visit: Payer: Self-pay | Admitting: Internal Medicine

## 2014-07-08 DIAGNOSIS — F988 Other specified behavioral and emotional disorders with onset usually occurring in childhood and adolescence: Secondary | ICD-10-CM

## 2014-07-08 MED ORDER — AMPHETAMINE-DEXTROAMPHETAMINE 20 MG PO TABS: 20.0000 mg | ORAL_TABLET | Freq: Two times a day (BID) | ORAL | Status: DC

## 2014-07-08 MED ORDER — ALPRAZOLAM 0.25 MG PO TABS: 0.2500 mg | ORAL_TABLET | Freq: Two times a day (BID) | ORAL | Status: DC | PRN

## 2014-07-08 NOTE — Telephone Encounter (Signed)
Patient's mother states that patient needs a refill on Xanax and Adderrall. Mother will be picking up for patient.  [phone]

## 2014-07-08 NOTE — Telephone Encounter (Signed)
Needs f/u before may 11 or after June 19  Meds ordered this encounter  Medications  . amphetamine-dextroamphetamine (ADDERALL) 20 MG tablet    Sig: Take 1 tablet (20 mg total) by mouth 2 (two) times daily.    Dispense:  60 tablet    Refill:  0  . ALPRAZolam (XANAX) 0.25 MG tablet    Sig: Take 1 tablet (0.25 mg total) by mouth 2 (two) times daily as needed for anxiety.    Dispense:  20 tablet    Refill:  1

## 2014-07-09 NOTE — Telephone Encounter (Signed)
Notified pt on VM that Rx is ready and need for f/up w/in dates below.

## 2014-09-16 ENCOUNTER — Telehealth: Payer: Self-pay

## 2014-09-16 DIAGNOSIS — F988 Other specified behavioral and emotional disorders with onset usually occurring in childhood and adolescence: Secondary | ICD-10-CM

## 2014-09-16 MED ORDER — AMPHETAMINE-DEXTROAMPHETAMINE 20 MG PO TABS: 20.0000 mg | ORAL_TABLET | Freq: Two times a day (BID) | ORAL | Status: DC

## 2014-09-16 NOTE — Telephone Encounter (Signed)
Pt in need of her ADDERALL 20 MG. Please call [phone] when ready

## 2014-09-16 NOTE — Telephone Encounter (Signed)
Meds ordered this encounter  Medications  . amphetamine-dextroamphetamine (ADDERALL) 20 MG tablet    Sig: Take 1 tablet (20 mg total) by mouth 2 (two) times daily.    Dispense:  60 tablet    Refill:  0   then, Needs f/u before more meds

## 2014-09-17 NOTE — Telephone Encounter (Signed)
Notified pt Rx ready and need for f/up for more. Pt agreed.

## 2014-10-16 ENCOUNTER — Telehealth: Payer: Self-pay | Admitting: Certified Nurse Midwife

## 2014-10-16 NOTE — Telephone Encounter (Signed)
04/28/14 Gildess 24 FE #3 packs/4 rfs was sent to pharmacy.  [facility] and s/w "Toniann FailWendy" she said the patient has 3 refill remaining Called patient and notified her of this and suggested that she contact her pharmacy to check to see if she has refills. Patient is agreeable and aware of refills remaining.  Encounter closed.

## 2014-10-16 NOTE — Telephone Encounter (Signed)
Patient states she need refill on birthcontrol Rx. Patient states she would rather be able to call in Rx herself and would like to get info on how to do so. Patient ok for call back

## 2014-12-02 ENCOUNTER — Encounter: Payer: Self-pay | Admitting: Internal Medicine

## 2014-12-02 ENCOUNTER — Ambulatory Visit (INDEPENDENT_AMBULATORY_CARE_PROVIDER_SITE_OTHER): Payer: PRIVATE HEALTH INSURANCE | Admitting: Internal Medicine

## 2014-12-02 VITALS — BP 127/80 | HR 65 | Temp 98.5°F | Resp 16 | Ht 63.5 in | Wt 138.0 lb

## 2014-12-02 DIAGNOSIS — F909 Attention-deficit hyperactivity disorder, unspecified type: Secondary | ICD-10-CM

## 2014-12-02 DIAGNOSIS — F988 Other specified behavioral and emotional disorders with onset usually occurring in childhood and adolescence: Secondary | ICD-10-CM

## 2014-12-02 MED ORDER — AMPHETAMINE-DEXTROAMPHETAMINE 20 MG PO TABS: 20.0000 mg | ORAL_TABLET | Freq: Two times a day (BID) | ORAL | Status: AC

## 2014-12-02 MED ORDER — AMPHETAMINE-DEXTROAMPHETAMINE 20 MG PO TABS
20.0000 mg | ORAL_TABLET | Freq: Two times a day (BID) | ORAL | Status: AC
Start: 2014-12-02 — End: ?

## 2014-12-02 NOTE — Progress Notes (Signed)
F/u Patient Active Problem List   Diagnosis Date Noted  . 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 OCD (obsessive compulsive disorder) causing anxiety 01/02/2012  . 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 Her last year was extremely successful and she was able to be off of all medications except for ADD medications which she took intermittently Her anxiety was able to be controlled with behavioral techniques that she has developed She never required any Xanax until after graduation when she had a few days of discomfort facing the unknown of job searching She has remained healthy Has a GYN and is in compliance Unsure where next job may be  Plan We discussed all the options someone in her current situation We discussed the challenges to her self-esteem and the possible recurrence of symptoms She currently has a very good understanding of self  Meds ordered this encounter  Medications  . norethindrone-ethinyl estradiol (MICROGESTIN,JUNEL,LOESTRIN) 1-20 MG-MCG tablet    Sig: Take 1 tablet by mouth daily. from her GYN   . amphetamine-dextroamphetamine (ADDERALL) 20 MG tablet    Sig: Take 1 tablet (20 mg total) by mouth 2 (two) times daily.    Dispense:  60 tablet    Refill:  0  . amphetamine-dextroamphetamine (ADDERALL) 20 MG tablet    Sig: Take 1 tablet (20 mg total) by mouth 2 (two) times daily. For 30d after signed    Dispense:  60 tablet    Refill:  0  . amphetamine-dextroamphetamine (ADDERALL) 20 MG tablet    Sig: Take 1 tablet (20 mg total) by mouth 2 (two) times daily. For 60d after signed    Dispense:  60 tablet    Refill:  0  She may call for refill on alprazolam if needed over this next year Follow-up one year or sooner as needed

## 2014-12-22 ENCOUNTER — Telehealth: Payer: Self-pay

## 2014-12-22 ENCOUNTER — Other Ambulatory Visit: Payer: Self-pay | Admitting: Physician Assistant

## 2014-12-22 MED ORDER — ALPRAZOLAM 0.25 MG PO TABS: 0.2500 mg | ORAL_TABLET | Freq: Two times a day (BID) | ORAL | Status: AC | PRN

## 2014-12-22 NOTE — Telephone Encounter (Signed)
Pt is moving up to [place] for a job and is needing a refill on her zanax from dr Merla Riches

## 2014-12-22 NOTE — Telephone Encounter (Signed)
This is reasonable.  Please call patient back and confirm that she is taking her oral contraceptive.  If she has missed any doses she will need a negative pregnancy test before picking up her new script. Deliah Boston, MS, PA-C   2:35 PM, 12/22/2014

## 2014-12-22 NOTE — Telephone Encounter (Signed)
Called in Rx, pt notified. 

## 2014-12-23 ENCOUNTER — Telehealth: Payer: Self-pay

## 2014-12-23 NOTE — Telephone Encounter (Signed)
PA needed for Adderall 20. Completed on phone and asked for expedited review since pt is leaving the state tomorrow for new job. Pending.

## 2014-12-25 NOTE — Telephone Encounter (Signed)
PA was denied because the req'd med is only covered when "written by or in consultation with a mental health specialist". Dr Merla Riches, I didn't see any notes about pt having seen a psychiatrist for this. It looks like you gave her the ADD/ADHD self report test and first Rxd the med on 03/31/12. Do you want to appeal, or do we need to just tell pt she needs to see a psychiatrist for this where she has moved?

## 2014-12-25 NOTE — Telephone Encounter (Signed)
1st time this has happened She may fill the rx without using her insurance--[email or web address] will tell of the lowest price in town If needs to use insurance she will have to go by their rules and see psychiatry i guess

## 2014-12-25 NOTE — Telephone Encounter (Signed)
Notified pharm of denial. Dr Merla Riches, please see note below.

## 2014-12-25 NOTE — Telephone Encounter (Signed)
Notified pt of denial and advice from Dr Merla Riches. She agreed to try w/out ins and call us if she needs a referral to psychiatrist with the name and contact info for one up where she is now living.

## 2015-04-12 ENCOUNTER — Encounter: Payer: Self-pay | Admitting: Internal Medicine

## 2015-05-18 ENCOUNTER — Ambulatory Visit: Payer: PRIVATE HEALTH INSURANCE | Admitting: Certified Nurse Midwife
